# Patient Record
Sex: Male | Born: 1985
Health system: Southern US, Community
[De-identification: ages and names within clinical notes are randomized; demographics above are authoritative.]

## PROBLEM LIST (undated history)

## (undated) DIAGNOSIS — M7752 Other enthesopathy of left foot: Secondary | ICD-10-CM

## (undated) HISTORY — PX: UMBILICAL HERNIA REPAIR: SHX196

## (undated) HISTORY — PX: WISDOM TOOTH EXTRACTION: SHX21

## (undated) HISTORY — DX: Other enthesopathy of left foot and ankle: M77.52

---

## 2004-09-05 ENCOUNTER — Emergency Department (HOSPITAL_COMMUNITY): Admission: EM | Admit: 2004-09-05 | Discharge: 2004-09-05 | Payer: Self-pay | Admitting: Family Medicine

## 2004-11-23 ENCOUNTER — Emergency Department (HOSPITAL_COMMUNITY): Admission: EM | Admit: 2004-11-23 | Discharge: 2004-11-23 | Payer: Self-pay | Admitting: Family Medicine

## 2008-01-08 ENCOUNTER — Emergency Department (HOSPITAL_COMMUNITY): Admission: EM | Admit: 2008-01-08 | Discharge: 2008-01-08 | Payer: Self-pay | Admitting: Emergency Medicine

## 2008-04-13 ENCOUNTER — Emergency Department (HOSPITAL_COMMUNITY): Admission: EM | Admit: 2008-04-13 | Discharge: 2008-04-13 | Payer: Self-pay | Admitting: Family Medicine

## 2011-04-25 LAB — OCCULT BLOOD X 1 CARD TO LAB, STOOL: Fecal Occult Bld: NEGATIVE

## 2012-04-15 ENCOUNTER — Other Ambulatory Visit (HOSPITAL_BASED_OUTPATIENT_CLINIC_OR_DEPARTMENT_OTHER): Payer: Self-pay | Admitting: Nurse Practitioner

## 2012-04-15 ENCOUNTER — Ambulatory Visit (HOSPITAL_BASED_OUTPATIENT_CLINIC_OR_DEPARTMENT_OTHER)
Admission: RE | Admit: 2012-04-15 | Discharge: 2012-04-15 | Disposition: A | Discharge status: Home / Self Care | Payer: Managed Care, Other (non HMO) | Source: Ambulatory Visit | Attending: Nurse Practitioner | Admitting: Nurse Practitioner

## 2012-04-15 DIAGNOSIS — M79609 Pain in unspecified limb: Secondary | ICD-10-CM

## 2012-04-15 DIAGNOSIS — M7730 Calcaneal spur, unspecified foot: Secondary | ICD-10-CM | POA: Insufficient documentation

## 2014-04-20 LAB — LIPID PANEL
Cholesterol: 247 mg/dL — AB (ref 0–200)
HDL: 46 mg/dL (ref 35–70)
LDL Cholesterol: 167 mg/dL
Triglycerides: 171 mg/dL — AB (ref 40–160)

## 2014-06-18 ENCOUNTER — Emergency Department (HOSPITAL_COMMUNITY)
Admission: EM | Admit: 2014-06-18 | Discharge: 2014-06-18 | Disposition: A | Discharge status: Home / Self Care | Payer: BC Managed Care – PPO | Source: Home / Self Care | Attending: Emergency Medicine | Admitting: Emergency Medicine

## 2014-06-18 ENCOUNTER — Encounter (HOSPITAL_COMMUNITY): Payer: Self-pay | Admitting: Emergency Medicine

## 2014-06-18 DIAGNOSIS — G44209 Tension-type headache, unspecified, not intractable: Secondary | ICD-10-CM

## 2014-06-18 DIAGNOSIS — Z8679 Personal history of other diseases of the circulatory system: Secondary | ICD-10-CM

## 2014-06-18 NOTE — ED Notes (Addendum)
C/o HA onset 2 hours; thinks it may be his BP Reports BP was 180/110 while watching a college foot game in the stadium EMS came out and assessed pt; referred to [place] Denies HA, CP, weakness, diaphoresis  Last had ibup an hour ago Alert, talking in complete sentences w/no signs of acute distress.

## 2014-06-18 NOTE — Discharge Instructions (Signed)

## 2014-06-18 NOTE — ED Provider Notes (Signed)
CSN: [id number]     Arrival date & time 06/18/14  1702 History   First MD Initiated Contact with Patient 06/18/14 1710     No chief complaint on file.  (Consider location/radiation/quality/duration/timing/severity/associated sxs/prior Treatment) HPI          28 year old male presents for evaluation of high blood pressure and headache. He was at a football game earlier today when he started to experience a bad headache in the left side of his head. He was seen by EMS, his blood pressure was found to be 180/110. He was told to follow-up at the urgent care. Now, his headache is mostly resolved and he is mostly asymptomatic. He still has a mild headache across his forehead. He denies any symptoms. He specifically denies chest pain, shortness of breath, nausea, vomiting, dizziness, visual changes, numbness, or weakness. He does not take any prescribed medicines.  History reviewed. No pertinent past medical history. History reviewed. No pertinent past surgical history. No family history on file. History  Substance Use Topics  . Smoking status: Never Smoker   . Smokeless tobacco: Not on file  . Alcohol Use: No    Review of Systems  Eyes: Negative for visual disturbance.  Gastrointestinal: Negative for nausea and vomiting.  Neurological: Positive for headaches. Negative for dizziness, weakness and numbness.  All other systems reviewed and are negative.   Allergies  Review of patient's allergies indicates no known allergies.  Home Medications   Prior to Admission medications   Medication Sig Start Date End Date Taking? Authorizing Provider  ibuprofen (ADVIL,MOTRIN) 200 MG tablet Take 200 mg by mouth every 6 (six) hours as needed.   Yes Historical Provider, MD   BP 145/87 mmHg  Pulse 98  Temp(Src) 98.7 F (37.1 C) (Oral)  Resp 16  SpO2 96% Physical Exam  Constitutional: He is oriented to person, place, and time. He appears well-developed and well-nourished. No distress.  HENT:   Head: Normocephalic.  Cardiovascular: Normal rate, regular rhythm, normal heart sounds and intact distal pulses.   Pulmonary/Chest: Effort normal and breath sounds normal. No respiratory distress.  Neurological: He is alert and oriented to person, place, and time. He has normal strength and normal reflexes. No cranial nerve deficit or sensory deficit. He exhibits normal muscle tone. He displays a negative Romberg sign. Coordination and gait normal. GCS eye subscore is 4. GCS verbal subscore is 5. GCS motor subscore is 6.  Skin: Skin is warm and dry. No rash noted. He is not diaphoretic.  Psychiatric: He has a normal mood and affect. Judgment normal.  Nursing note and vitals reviewed.   ED Course  Procedures (including critical care time) Labs Review Labs Reviewed - No data to display  Imaging Review No results found.   MDM   1. Tension headache   2. History of high blood pressure    Exam normal and is BP only slightly elevated. He has a tension headache, no specific interventions are required at this time.  Advised excedrin migraine if headache persists.  Otherwise follow-up with primary care within 1 week for blood pressure recheck    Liam Graham, PA-C 06/18/14 1744

## 2016-07-02 ENCOUNTER — Ambulatory Visit: Payer: Self-pay | Admitting: Internal Medicine

## 2016-07-05 ENCOUNTER — Encounter: Payer: Self-pay | Admitting: Internal Medicine

## 2016-07-05 ENCOUNTER — Ambulatory Visit (INDEPENDENT_AMBULATORY_CARE_PROVIDER_SITE_OTHER): Payer: BLUE CROSS/BLUE SHIELD | Admitting: Internal Medicine

## 2016-07-05 VITALS — BP 120/76 | HR 78 | Resp 16 | Ht 71.75 in | Wt 246.0 lb

## 2016-07-05 DIAGNOSIS — M7732 Calcaneal spur, left foot: Secondary | ICD-10-CM

## 2016-07-05 DIAGNOSIS — G5602 Carpal tunnel syndrome, left upper limb: Secondary | ICD-10-CM

## 2016-07-05 DIAGNOSIS — J4520 Mild intermittent asthma, uncomplicated: Secondary | ICD-10-CM | POA: Insufficient documentation

## 2016-07-05 DIAGNOSIS — E782 Mixed hyperlipidemia: Secondary | ICD-10-CM | POA: Diagnosis not present

## 2016-07-05 NOTE — Patient Instructions (Signed)
Podiatrists in [place]:  Dr. Loralyn Freshwater      Dr. Elta Guadeloupe Pifer      Dr. Iredale/Dr. Santiago Bur  Ibuprofen 200 mg take 3 tablets three times a day with food

## 2016-07-05 NOTE — Progress Notes (Signed)
Date:  07/05/2016   Name:  Richard Paul   DOB:  02/01/1986   MRN:  [id number]   Chief Complaint: Establish Care Richard [facility] ); Elbow Injury (Left elbow pain now and then for years and then recently strained same area. ); and Foot Pain (Bone Spur used to get injection 2 years ago. Gets flare ups at times. ) Foot Pain  This is a recurrent problem. The current episode started more than 1 year ago. The problem occurs intermittently. The problem has been gradually worsening (diagnosed as a bone spur - treated with injections by podiatry). Associated symptoms include arthralgias and myalgias. Pertinent negatives include no abdominal pain, chest pain, coughing, fatigue, headaches, numbness, rash or weakness. The symptoms are aggravated by walking and standing.  Asthma  He complains of chest tightness. There is no cough, shortness of breath or wheezing. This is a recurrent problem. The current episode started more than 1 year ago (age 30). Associated symptoms include myalgias. Pertinent negatives include no appetite change, chest pain or headaches. His past medical history is significant for asthma.  Elbow pain - left elbow injury during football in high school.  Did okay  pain for a while and now more problems, especially when he rests on a hard surface.  No weakness or tingling, no elbow swelling.     Review of Systems  Constitutional: Negative for appetite change, fatigue and unexpected weight change.  Respiratory: Negative for cough, shortness of breath and wheezing.   Cardiovascular: Negative for chest pain, palpitations and leg swelling.  Gastrointestinal: Negative for abdominal pain.  Endocrine: Negative for polydipsia and polyuria.  Genitourinary: Negative for dysuria and hematuria.  Musculoskeletal: Positive for arthralgias and myalgias.  Skin: Negative for color change and rash.  Neurological: Negative for tremors, weakness, numbness and headaches.    Psychiatric/Behavioral: Negative for dysphoric mood.    Patient Active Problem List   Diagnosis Date Noted  . Hyperlipidemia, mixed 07/05/2016    Prior to Admission medications   Not on File    No Known Allergies  Past Surgical History:  Procedure Laterality Date  . UMBILICAL HERNIA REPAIR    . WISDOM TOOTH EXTRACTION      Social History  Substance Use Topics  . Smoking status: Never Smoker  . Smokeless tobacco: Never Used  . Alcohol use Yes     Comment: occassion      Medication list has been reviewed and updated.   Physical Exam  Constitutional: He is oriented to person, place, and time. He appears well-developed. No distress.  HENT:  Head: Normocephalic and atraumatic.  Right Ear: Tympanic membrane and ear canal normal.  Left Ear: Tympanic membrane and ear canal normal.  Mouth/Throat: Oropharynx is clear and moist.  Neck: Normal range of motion. Neck supple. Carotid bruit is not present. No thyromegaly present.  Cardiovascular: Normal rate, regular rhythm and normal heart sounds.   Pulmonary/Chest: Effort normal and breath sounds normal. No respiratory distress. He has no wheezes.  Musculoskeletal: Normal range of motion.       Left elbow: He exhibits no swelling, no effusion and no deformity. No tenderness found.       Left forearm: He exhibits no tenderness and no bony tenderness.       Arms:      Feet:  Neurological: He is alert and oriented to person, place, and time. He has normal strength and normal reflexes. No cranial nerve deficit or sensory deficit.  Skin: Skin is warm  and dry. No rash noted.  Psychiatric: He has a normal mood and affect. His speech is normal and behavior is normal. Thought content normal.  Nursing note and vitals reviewed.   BP 120/76   Pulse 78   Resp 16   Ht 5' 11.75" (1.822 m)   Wt 246 lb (111.6 kg)   SpO2 98%   BMI 33.60 kg/m   Assessment and Plan: 1. Heel spur, left Recommend Advil 600-800 mg tid Consult Podiatry  if not improved  2. 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 Mild intermittent asthma without complication Continue albuterol MDI PRN Recommend flu vaccine but patient declines  4. Hyperlipidemia, mixed Will recheck at [place], MD [facility]  07/05/2016

## 2017-01-03 ENCOUNTER — Ambulatory Visit (INDEPENDENT_AMBULATORY_CARE_PROVIDER_SITE_OTHER): Payer: BLUE CROSS/BLUE SHIELD | Admitting: Internal Medicine

## 2017-01-03 ENCOUNTER — Encounter: Payer: Self-pay | Admitting: Internal Medicine

## 2017-01-03 VITALS — BP 120/82 | HR 84 | Temp 97.6°F | Ht 71.0 in | Wt 243.0 lb

## 2017-01-03 DIAGNOSIS — J4520 Mild intermittent asthma, uncomplicated: Secondary | ICD-10-CM

## 2017-01-03 DIAGNOSIS — G5602 Carpal tunnel syndrome, left upper limb: Secondary | ICD-10-CM

## 2017-01-03 DIAGNOSIS — E782 Mixed hyperlipidemia: Secondary | ICD-10-CM

## 2017-01-03 DIAGNOSIS — Z0001 Encounter for general adult medical examination with abnormal findings: Secondary | ICD-10-CM

## 2017-01-03 DIAGNOSIS — Z0000 Encounter for general adult medical examination without abnormal findings: Secondary | ICD-10-CM

## 2017-01-03 LAB — POCT URINALYSIS DIPSTICK
Bilirubin, UA: NEGATIVE
Glucose, UA: NEGATIVE
Ketones, UA: NEGATIVE
Leukocytes, UA: NEGATIVE
Nitrite, UA: NEGATIVE
Spec Grav, UA: 1.01 (ref 1.010–1.025)
Urobilinogen, UA: 0.2 E.U./dL
pH, UA: 6 (ref 5.0–8.0)

## 2017-01-03 MED ORDER — FLUTICASONE PROPIONATE 50 MCG/ACT NA SUSP: 2.0000 | Freq: Every day | NASAL | 12 refills | Status: DC

## 2017-01-03 NOTE — Progress Notes (Signed)
Date:  01/03/2017   Name:  Richard Paul   DOB:  1986/03/02   MRN:  [id number]   Chief Complaint: Annual Exam; Hyperlipidemia; and Asthma Richard BEENEY is a 31 y.o. male who presents today for his Complete Annual Exam. He feels fairly well. He reports exercising regularly. He reports he is sleeping well.   Hyperlipidemia  This is a chronic problem. Pertinent negatives include no chest pain, myalgias or shortness of breath. Current antihyperlipidemic treatment includes diet change.  Asthma  There is no shortness of breath or wheezing. This is a recurrent problem. The problem occurs rarely. The problem has been unchanged. Pertinent negatives include no appetite change, chest pain, headaches, myalgias or trouble swallowing. His symptoms are alleviated by beta-agonist. He reports significant improvement on treatment. His past medical history is significant for asthma.  Elbow pain - intermittent left elbow shooting pains stemming from an old football injury.    Review of Systems  Constitutional: Negative for appetite change, chills, diaphoresis, fatigue and unexpected weight change.  HENT: Negative for hearing loss, tinnitus, trouble swallowing and voice change.   Eyes: Negative for visual disturbance.  Respiratory: Positive for chest tightness. Negative for choking, shortness of breath and wheezing.   Cardiovascular: Negative for chest pain, palpitations and leg swelling.  Gastrointestinal: Negative for abdominal pain, blood in stool, constipation and diarrhea.  Genitourinary: Negative for difficulty urinating, dysuria and frequency.  Musculoskeletal: Positive for arthralgias. Negative for back pain and myalgias.  Skin: Negative for color change and rash.  Allergic/Immunologic: Positive for environmental allergies.  Neurological: Negative for dizziness, syncope and headaches.  Hematological: Negative for adenopathy.  Psychiatric/Behavioral: Negative for dysphoric mood and sleep  disturbance.    Patient Active Problem List   Diagnosis Date Noted  . Hyperlipidemia, mixed 07/05/2016  . Heel spur, left 07/05/2016  . 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 Mild intermittent asthma without complication 40/98/1191    Prior to Admission medications   Medication Sig Start Date End Date Taking? Authorizing Provider  ALBUTEROL SULFATE HFA IN Inhale 2 puffs into the lungs every 6 (six) hours as needed.    [provider]  fluticasone (FLONASE) 50 MCG/ACT nasal spray Place into both nostrils daily.    [provider]  ibuprofen (ADVIL,MOTRIN) 800 MG tablet Take 800 mg by mouth every 8 (eight) hours as needed.    [provider]  Multiple Vitamins-Minerals (ALIVE MENS ENERGY) TABS Take by mouth.    [provider]  Safflower Oil (TONALIN CLA PO) Take by mouth.    [provider]    No Known Allergies  Past Surgical History:  Procedure Laterality Date  . UMBILICAL HERNIA REPAIR    . WISDOM TOOTH EXTRACTION      Social History  Substance Use Topics  . Smoking status: Never Smoker  . Smokeless tobacco: Never Used  . Alcohol use Yes     Comment: occassion    Depression screen [facility] 2/9 01/03/2017  Decreased Interest 0  Down, Depressed, Hopeless 0  PHQ - 2 Score 0     Medication list has been reviewed and updated.   Physical Exam  Constitutional: He is oriented to person, place, and time. He appears well-developed and well-nourished.  HENT:  Head: Normocephalic.  Right Ear: Tympanic membrane, external ear and ear canal normal.  Left Ear: Tympanic membrane, external ear and ear canal normal.  Nose: Nose normal.  Mouth/Throat: Uvula is midline and oropharynx is clear and  moist.  Eyes: Conjunctivae and EOM are normal. Pupils are equal, round, and reactive to light.  Neck: Normal range of motion. Neck supple. Carotid bruit is not present. No thyromegaly present.  Cardiovascular: Normal rate, regular  rhythm, normal heart sounds and intact distal pulses.   Pulmonary/Chest: Effort normal and breath sounds normal. He has no wheezes. Right breast exhibits no mass. Left breast exhibits no mass.  Abdominal: Soft. Normal appearance and bowel sounds are normal. There is no hepatosplenomegaly. There is no tenderness.  Musculoskeletal: Normal range of motion.       Arms: Lymphadenopathy:    He has no cervical adenopathy.  Neurological: He is alert and oriented to person, place, and time. He has normal strength and normal reflexes. No sensory deficit.  Skin: Skin is warm, dry and intact.  Psychiatric: He has a normal mood and affect. His speech is normal and behavior is normal. Judgment and thought content normal.  Nursing note and vitals reviewed.   BP 120/82   Pulse 84   Temp 97.6 F (36.4 C)   Ht 5\' 11"  (1.803 m)   Wt 243 lb (110.2 kg)   SpO2 98%   BMI 33.89 kg/m   Assessment and Plan: 1. Annual physical exam Normal exam except for weight - begin regular exercise/aerobic activity - POCT urinalysis dipstick  2. Mild intermittent asthma without complication stable - CBC with Differential/Platelet  3. Hyperlipidemia, mixed Advise on medication - Comprehensive metabolic panel - Lipid panel  4. 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 fluticasone (FLONASE) 50 MCG/ACT nasal spray    Sig: Place 2 sprays into both nostrils daily.    Dispense:  16 g    Refill:  [place], MD [facility]  01/03/2017

## 2017-01-03 NOTE — Patient Instructions (Signed)
DASH Eating Plan DASH stands for "Dietary Approaches to Stop Hypertension." The DASH eating plan is a healthy eating plan that has been shown to reduce high blood pressure (hypertension). It may also reduce your risk for type 2 diabetes, heart disease, and stroke. The DASH eating plan may also help with weight loss. What are tips for following this plan? General guidelines  Avoid eating more than 2,300 mg (milligrams) of salt (sodium) a day. If you have hypertension, you may need to reduce your sodium intake to 1,500 mg a day.  Limit alcohol intake to no more than 1 drink a day for nonpregnant women and 2 drinks a day for men. One drink equals 12 oz of beer, 5 oz of wine, or 1 oz of hard liquor.  Work with your health care provider to maintain a healthy body weight or to lose weight. Ask what an ideal weight is for you.  Get at least 30 minutes of exercise that causes your heart to beat faster (aerobic exercise) most days of the week. Activities may include walking, swimming, or biking.  Work with your health care provider or diet and nutrition specialist (dietitian) to adjust your eating plan to your individual calorie needs. Reading food labels  Check food labels for the amount of sodium per serving. Choose foods with less than 5 percent of the Daily Value of sodium. Generally, foods with less than 300 mg of sodium per serving fit into this eating plan.  To find whole grains, look for the word "whole" as the first word in the ingredient list. Shopping  Buy products labeled as "low-sodium" or "no salt added."  Buy fresh foods. Avoid canned foods and premade or frozen meals. Cooking  Avoid adding salt when cooking. Use salt-free seasonings or herbs instead of table salt or sea salt. Check with your health care provider or pharmacist before using salt substitutes.  Do not fry foods. Cook foods using healthy methods such as baking, boiling, grilling, and broiling instead.  Cook with  heart-healthy oils, such as olive, canola, soybean, or sunflower oil. Meal planning   Eat a balanced diet that includes: ? 5 or more servings of fruits and vegetables each day. At each meal, try to fill half of your plate with fruits and vegetables. ? Up to 6-8 servings of whole grains each day. ? Less than 6 oz of lean meat, poultry, or fish each day. A 3-oz serving of meat is about the same size as a deck of cards. One egg equals 1 oz. ? 2 servings of low-fat dairy each day. ? A serving of nuts, seeds, or beans 5 times each week. ? Heart-healthy fats. Healthy fats called Omega-3 fatty acids are found in foods such as flaxseeds and coldwater fish, like sardines, salmon, and mackerel.  Limit how much you eat of the following: ? Canned or prepackaged foods. ? Food that is high in trans fat, such as fried foods. ? Food that is high in saturated fat, such as fatty meat. ? Sweets, desserts, sugary drinks, and other foods with added sugar. ? Full-fat dairy products.  Do not salt foods before eating.  Try to eat at least 2 vegetarian meals each week.  Eat more home-cooked food and less restaurant, buffet, and fast food.  When eating at a restaurant, ask that your food be prepared with less salt or no salt, if possible. What foods are recommended? The items listed may not be a complete list. Talk with your dietitian about what   dietary choices are best for you. Grains Whole-grain or whole-wheat bread. Whole-grain or whole-wheat pasta. Brown rice. Oatmeal. Quinoa. Bulgur. Whole-grain and low-sodium cereals. Pita bread. Low-fat, low-sodium crackers. Whole-wheat flour tortillas. Vegetables Fresh or frozen vegetables (raw, steamed, roasted, or grilled). Low-sodium or reduced-sodium tomato and vegetable juice. Low-sodium or reduced-sodium tomato sauce and tomato paste. Low-sodium or reduced-sodium canned vegetables. Fruits All fresh, dried, or frozen fruit. Canned fruit in natural juice (without  added sugar). Meat and other protein foods Skinless chicken or turkey. Ground chicken or turkey. Pork with fat trimmed off. Fish and seafood. Egg whites. Dried beans, peas, or lentils. Unsalted nuts, nut butters, and seeds. Unsalted canned beans. Lean cuts of beef with fat trimmed off. Low-sodium, lean deli meat. Dairy Low-fat (1%) or fat-free (skim) milk. Fat-free, low-fat, or reduced-fat cheeses. Nonfat, low-sodium ricotta or cottage cheese. Low-fat or nonfat yogurt. Low-fat, low-sodium cheese. Fats and oils Soft margarine without trans fats. Vegetable oil. Low-fat, reduced-fat, or light mayonnaise and salad dressings (reduced-sodium). Canola, safflower, olive, soybean, and sunflower oils. Avocado. Seasoning and other foods Herbs. Spices. Seasoning mixes without salt. Unsalted popcorn and pretzels. Fat-free sweets. What foods are not recommended? The items listed may not be a complete list. Talk with your dietitian about what dietary choices are best for you. Grains Baked goods made with fat, such as croissants, muffins, or some breads. Dry pasta or rice meal packs. Vegetables Creamed or fried vegetables. Vegetables in a cheese sauce. Regular canned vegetables (not low-sodium or reduced-sodium). Regular canned tomato sauce and paste (not low-sodium or reduced-sodium). Regular tomato and vegetable juice (not low-sodium or reduced-sodium). Pickles. Olives. Fruits Canned fruit in a light or heavy syrup. Fried fruit. Fruit in cream or butter sauce. Meat and other protein foods Fatty cuts of meat. Ribs. Fried meat. Bacon. Sausage. Bologna and other processed lunch meats. Salami. Fatback. Hotdogs. Bratwurst. Salted nuts and seeds. Canned beans with added salt. Canned or smoked fish. Whole eggs or egg yolks. Chicken or turkey with skin. Dairy Whole or 2% milk, cream, and half-and-half. Whole or full-fat cream cheese. Whole-fat or sweetened yogurt. Full-fat cheese. Nondairy creamers. Whipped toppings.  Processed cheese and cheese spreads. Fats and oils Butter. Stick margarine. Lard. Shortening. Ghee. Bacon fat. Tropical oils, such as coconut, palm kernel, or palm oil. Seasoning and other foods Salted popcorn and pretzels. Onion salt, garlic salt, seasoned salt, table salt, and sea salt. Worcestershire sauce. Tartar sauce. Barbecue sauce. Teriyaki sauce. Soy sauce, including reduced-sodium. Steak sauce. Canned and packaged gravies. Fish sauce. Oyster sauce. Cocktail sauce. Horseradish that you find on the shelf. Ketchup. Mustard. Meat flavorings and tenderizers. Bouillon cubes. Hot sauce and Tabasco sauce. Premade or packaged marinades. Premade or packaged taco seasonings. Relishes. Regular salad dressings. Where to find more information:  National Heart, Lung, and Blood Institute: www.nhlbi.nih.gov  American Heart Association: www.heart.org Summary  The DASH eating plan is a healthy eating plan that has been shown to reduce high blood pressure (hypertension). It may also reduce your risk for type 2 diabetes, heart disease, and stroke.  With the DASH eating plan, you should limit salt (sodium) intake to 2,300 mg a day. If you have hypertension, you may need to reduce your sodium intake to 1,500 mg a day.  When on the DASH eating plan, aim to eat more fresh fruits and vegetables, whole grains, lean proteins, low-fat dairy, and heart-healthy fats.  Work with your health care provider or diet and nutrition specialist (dietitian) to adjust your eating plan to your individual   calorie needs. This information is not intended to replace advice given to you by your health care provider. Make sure you discuss any questions you have with your health care provider. Document Released: 07/04/2011 Document Revised: 07/08/2016 Document Reviewed: 07/08/2016 Elsevier Interactive Patient Education  2017 [employer]  

## 2017-01-04 LAB — CBC WITH DIFFERENTIAL/PLATELET
Basophils Absolute: 0 10*3/uL (ref 0.0–0.2)
Basos: 0 %
EOS (ABSOLUTE): 0 10*3/uL (ref 0.0–0.4)
Eos: 1 %
Hematocrit: 43.6 % (ref 37.5–51.0)
Hemoglobin: 14.5 g/dL (ref 13.0–17.7)
Immature Grans (Abs): 0 10*3/uL (ref 0.0–0.1)
Immature Granulocytes: 0 %
Lymphocytes Absolute: 2.1 10*3/uL (ref 0.7–3.1)
Lymphs: 46 %
MCH: 30.9 pg (ref 26.6–33.0)
MCHC: 33.3 g/dL (ref 31.5–35.7)
MCV: 93 fL (ref 79–97)
Monocytes Absolute: 0.2 10*3/uL (ref 0.1–0.9)
Monocytes: 5 %
Neutrophils Absolute: 2.2 10*3/uL (ref 1.4–7.0)
Neutrophils: 48 %
Platelets: 305 10*3/uL (ref 150–379)
RBC: 4.7 x10E6/uL (ref 4.14–5.80)
RDW: 13.6 % (ref 12.3–15.4)
WBC: 4.6 10*3/uL (ref 3.4–10.8)

## 2017-01-04 LAB — COMPREHENSIVE METABOLIC PANEL
ALT: 32 IU/L (ref 0–44)
AST: 26 IU/L (ref 0–40)
Albumin/Globulin Ratio: 1.7 (ref 1.2–2.2)
Albumin: 4.7 g/dL (ref 3.5–5.5)
Alkaline Phosphatase: 73 IU/L (ref 39–117)
BUN/Creatinine Ratio: 10 (ref 9–20)
BUN: 10 mg/dL (ref 6–20)
Bilirubin Total: 0.3 mg/dL (ref 0.0–1.2)
CO2: 24 mmol/L (ref 18–29)
Calcium: 10 mg/dL (ref 8.7–10.2)
Chloride: 103 mmol/L (ref 96–106)
Creatinine, Ser: 1 mg/dL (ref 0.76–1.27)
GFR calc Af Amer: 114 mL/min/{1.73_m2} (ref >59)
GFR calc non Af Amer: 98 mL/min/{1.73_m2} (ref >59)
Globulin, Total: 2.8 g/dL (ref 1.5–4.5)
Glucose: 90 mg/dL (ref 65–99)
Potassium: 4.5 mmol/L (ref 3.5–5.2)
Sodium: 142 mmol/L (ref 134–144)
Total Protein: 7.5 g/dL (ref 6.0–8.5)

## 2017-01-04 LAB — LIPID PANEL
Chol/HDL Ratio: 5.1 ratio — ABNORMAL HIGH (ref 0.0–5.0)
Cholesterol, Total: 250 mg/dL — ABNORMAL HIGH (ref 100–199)
HDL: 49 mg/dL (ref >39)
LDL Calculated: 172 mg/dL — ABNORMAL HIGH (ref 0–99)
Triglycerides: 145 mg/dL (ref 0–149)
VLDL Cholesterol Cal: 29 mg/dL (ref 5–40)

## 2017-01-16 DIAGNOSIS — S46312A Strain of muscle, fascia and tendon of triceps, left arm, initial encounter: Secondary | ICD-10-CM | POA: Diagnosis not present

## 2017-11-07 DIAGNOSIS — R0602 Shortness of breath: Secondary | ICD-10-CM | POA: Diagnosis not present

## 2017-11-07 DIAGNOSIS — R7989 Other specified abnormal findings of blood chemistry: Secondary | ICD-10-CM | POA: Diagnosis not present

## 2017-11-07 DIAGNOSIS — R0789 Other chest pain: Secondary | ICD-10-CM | POA: Diagnosis not present

## 2017-11-07 DIAGNOSIS — R05 Cough: Secondary | ICD-10-CM | POA: Diagnosis not present

## 2017-11-07 DIAGNOSIS — R079 Chest pain, unspecified: Secondary | ICD-10-CM | POA: Diagnosis not present

## 2017-11-10 ENCOUNTER — Telehealth: Payer: Self-pay

## 2017-11-10 NOTE — Telephone Encounter (Signed)
Patient stated he has recent visit in ER for panic attacks. Wanted to talk to Dr Army Melia about these panic attacks and possible medications to help with this. Called and left Vm informing needs to call office and schedule appt for discussion but we would be glad to see him.

## 2017-11-12 ENCOUNTER — Ambulatory Visit (INDEPENDENT_AMBULATORY_CARE_PROVIDER_SITE_OTHER): Payer: BLUE CROSS/BLUE SHIELD | Admitting: Internal Medicine

## 2017-11-12 ENCOUNTER — Encounter: Payer: Self-pay | Admitting: Internal Medicine

## 2017-11-12 VITALS — BP 128/78 | HR 70 | Ht 71.0 in | Wt 240.0 lb

## 2017-11-12 DIAGNOSIS — F411 Generalized anxiety disorder: Secondary | ICD-10-CM | POA: Insufficient documentation

## 2017-11-12 DIAGNOSIS — F419 Anxiety disorder, unspecified: Secondary | ICD-10-CM | POA: Diagnosis not present

## 2017-11-12 DIAGNOSIS — J4520 Mild intermittent asthma, uncomplicated: Secondary | ICD-10-CM | POA: Diagnosis not present

## 2017-11-12 MED ORDER — ALBUTEROL SULFATE HFA 108 (90 BASE) MCG/ACT IN AERS: 2.0000 | INHALATION_SPRAY | Freq: Four times a day (QID) | RESPIRATORY_TRACT | 2 refills | Status: DC | PRN

## 2017-11-12 MED ORDER — CLONAZEPAM 0.5 MG PO TABS: 0.5000 mg | ORAL_TABLET | Freq: Two times a day (BID) | ORAL | 0 refills | Status: DC | PRN

## 2017-11-12 NOTE — Progress Notes (Signed)
Date:  11/12/2017   Name:  Richard Paul   DOB:  October 26, 1985   MRN:  [id number]   Chief Complaint: Anxiety (Thursday while at the gym- felt SOB. Inhaler didn't seem to help much. Finished workout fine. Chest pain and heart was racing. Did not feel safe to sleep so went to ER. Last time this happened was about 10 years ago. GAD7- 9. )  EKG - NSR @ 86 CXR negative Troponin negative; CBC and CMP negative  He feels that he is more on edge and irritable than usual.  His father is having some health issues that are likely contributing to his sx. He is happy with work and home.  He has never taken medication for anxiety or depression.  Since his ED evaluation, he has had no further chest pain or chest heaviness.  He had not used his inhaler.  Asthma - mild intermittent sx; uses inhaler as needed.  Review of Systems  Constitutional: Negative for chills, fatigue and fever.  Respiratory: Negative for cough, chest tightness, shortness of breath and wheezing.   Cardiovascular: Negative for chest pain, palpitations and leg swelling.  Musculoskeletal: Negative for arthralgias.  Skin: Negative for color change and rash.  Neurological: Negative for dizziness and headaches.  Psychiatric/Behavioral: Negative for agitation, decreased concentration, dysphoric mood, self-injury, sleep disturbance and suicidal ideas. The patient is nervous/anxious.     Patient Active Problem List   Diagnosis Date Noted  . Hyperlipidemia, mixed 07/05/2016  . Heel spur, left 07/05/2016  . 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 Mild intermittent asthma without complication 40/98/1191    Prior to Admission medications   Medication Sig Start Date End Date Taking? Authorizing Provider  ALBUTEROL SULFATE HFA IN Inhale 2 puffs into the lungs every 6 (six) hours as needed.   Yes [provider]  fluticasone (FLONASE) 50 MCG/ACT nasal spray Place 2 sprays into both nostrils daily. 01/03/17  Yes  Glean Hess, MD  ibuprofen (ADVIL,MOTRIN) 800 MG tablet Take 800 mg by mouth every 8 (eight) hours as needed.   Yes [provider]  Multiple Vitamins-Minerals (ALIVE MENS ENERGY) TABS Take by mouth.   Yes [provider]    No Known Allergies  Past Surgical History:  Procedure Laterality Date  . UMBILICAL HERNIA REPAIR    . WISDOM TOOTH EXTRACTION      Social History   Tobacco Use  . Smoking status: Never Smoker  . Smokeless tobacco: Never Used  Substance Use Topics  . Alcohol use: Yes    Comment: occassion   . Drug use: No     Medication list has been reviewed and updated.  PHQ 2/9 Scores 11/12/2017 01/03/2017  PHQ - 2 Score 0 0   GAD 7 : Generalized Anxiety Score 11/12/2017  Nervous, Anxious, on Edge 2  Control/stop worrying 2  Worry too much - different things 2  Trouble relaxing 0  Restless 0  Easily annoyed or irritable 2  Afraid - awful might happen 1  Total GAD 7 Score 9  Anxiety Difficulty Not difficult at all    Physical Exam  Constitutional: He is oriented to person, place, and time. He appears well-developed. No distress.  HENT:  Head: Normocephalic and atraumatic.  Neck: Normal range of motion. Neck supple. Carotid bruit is not present.  Cardiovascular: Normal rate, regular rhythm and normal heart sounds.  Pulmonary/Chest: Effort normal and breath sounds normal. No respiratory distress.  Musculoskeletal: He exhibits no edema.  Neurological: He is alert and oriented to person, place, and time.  Skin: Skin is warm and dry. No rash noted.  Psychiatric: He has a normal mood and affect. His speech is normal and behavior is normal. Judgment and thought content normal. Cognition and memory are normal.    BP 128/78   Pulse 70   Ht 5\' 11"  (1.803 m)   Wt 240 lb (108.9 kg)   SpO2 98%   BMI 33.47 kg/m   Assessment and Plan: 1. Anxiety disorder, unspecified type Use clonazepam only PRN if sx occur Follow up if worsening to discuss  daily medications - clonazePAM (KLONOPIN) 0.5 MG tablet; Take 1 tablet (0.5 mg total) by mouth 2 (two) times daily as needed for anxiety.  Dispense: 15 tablet; Refill: 0  2. Mild intermittent asthma without complication Continue albuterol PRN  Meds ordered this encounter  Medications  . clonazePAM (KLONOPIN) 0.5 MG tablet    Sig: Take 1 tablet (0.5 mg total) by mouth 2 (two) times daily as needed for anxiety.    Dispense:  15 tablet    Refill:  0  . albuterol (PROVENTIL HFA;VENTOLIN HFA) 108 (90 Base) MCG/ACT inhaler    Sig: Inhale 2 puffs into the lungs every 6 (six) hours as needed.    Dispense:  18 g    Refill:  2    Substitute any covered generic albuterol inhaler    Partially dictated using Editor, commissioning. Any errors are unintentional.  Halina Maidens, MD [facility]  11/12/2017

## 2018-01-30 ENCOUNTER — Other Ambulatory Visit: Payer: Self-pay | Admitting: Internal Medicine

## 2018-06-09 ENCOUNTER — Encounter: Payer: BLUE CROSS/BLUE SHIELD | Admitting: Internal Medicine

## 2018-06-09 ENCOUNTER — Ambulatory Visit (INDEPENDENT_AMBULATORY_CARE_PROVIDER_SITE_OTHER): Payer: BLUE CROSS/BLUE SHIELD | Admitting: Internal Medicine

## 2018-06-09 ENCOUNTER — Encounter: Payer: Self-pay | Admitting: Internal Medicine

## 2018-06-09 VITALS — BP 132/82 | HR 75 | Ht 71.0 in | Wt 241.0 lb

## 2018-06-09 DIAGNOSIS — J4520 Mild intermittent asthma, uncomplicated: Secondary | ICD-10-CM | POA: Diagnosis not present

## 2018-06-09 DIAGNOSIS — Z23 Encounter for immunization: Secondary | ICD-10-CM

## 2018-06-09 DIAGNOSIS — Z0000 Encounter for general adult medical examination without abnormal findings: Secondary | ICD-10-CM

## 2018-06-09 LAB — POCT URINALYSIS DIPSTICK
Bilirubin, UA: NEGATIVE
Glucose, UA: NEGATIVE
Ketones, UA: NEGATIVE
Leukocytes, UA: NEGATIVE
Nitrite, UA: NEGATIVE
Protein, UA: NEGATIVE
Spec Grav, UA: 1.015 (ref 1.010–1.025)
Urobilinogen, UA: 0.2 E.U./dL
pH, UA: 6 (ref 5.0–8.0)

## 2018-06-09 NOTE — Progress Notes (Signed)
Date:  06/09/2018   Name:  Richard Paul   DOB:  1986/05/26   MRN:  [id number]   Chief Complaint: Annual Exam (Would like updated tetnus shot. Scratched by nail in august moving things in girlfriends home. Mychart let him know he needed a tetnus so he wanted to get one today. ) Richard Paul is a 32 y.o. male who presents today for his Complete Annual Exam. He feels well. He reports exercising regularly. He reports he is sleeping well.  He requests the TDap but declines influenza vaccine.  Asthma  He complains of wheezing. There is no shortness of breath. This is a recurrent problem. The problem occurs intermittently (about once a week uses albuterol when he works out). Pertinent negatives include no appetite change, chest pain, headaches, myalgias or trouble swallowing. His symptoms are alleviated by beta-agonist. His symptoms are not alleviated by cold air. His past medical history is significant for asthma.    Review of Systems  Constitutional: Negative for appetite change, chills, diaphoresis, fatigue and unexpected weight change.  HENT: Negative for hearing loss, tinnitus, trouble swallowing and voice change.   Eyes: Negative for visual disturbance.  Respiratory: Positive for wheezing. Negative for choking and shortness of breath.   Cardiovascular: Negative for chest pain, palpitations and leg swelling.  Gastrointestinal: Negative for abdominal pain, blood in stool, constipation and diarrhea.  Genitourinary: Negative for difficulty urinating, dysuria and frequency.  Musculoskeletal: Negative for arthralgias, back pain and myalgias.  Skin: Negative for color change and rash.  Allergic/Immunologic: Negative for environmental allergies.  Neurological: Negative for dizziness, syncope and headaches.  Hematological: Negative for adenopathy.  Psychiatric/Behavioral: Negative for dysphoric mood and sleep disturbance.    Patient Active Problem List   Diagnosis Date Noted  . Anxiety  disorder 11/12/2017  . Hyperlipidemia, mixed 07/05/2016  . Heel spur, left 07/05/2016  . 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 Mild intermittent asthma without complication 89/21/1941    No Known Allergies  Past Surgical History:  Procedure Laterality Date  . UMBILICAL HERNIA REPAIR    . WISDOM TOOTH EXTRACTION      Social History   Tobacco Use  . Smoking status: Never Smoker  . Smokeless tobacco: Never Used  Substance Use Topics  . Alcohol use: Yes    Comment: occassion   . Drug use: No     Medication list has been reviewed and updated.  Current Meds  Medication Sig  . albuterol (PROVENTIL HFA;VENTOLIN HFA) 108 (90 Base) MCG/ACT inhaler Inhale 2 puffs into the lungs every 6 (six) hours as needed.  . clonazePAM (KLONOPIN) 0.5 MG tablet Take 1 tablet (0.5 mg total) by mouth 2 (two) times daily as needed for anxiety.  . fluticasone (FLONASE) 50 MCG/ACT nasal spray SPRAY 2 SPRAYS INTO EACH NOSTRIL EVERY DAY  . ibuprofen (ADVIL,MOTRIN) 800 MG tablet Take 800 mg by mouth every 8 (eight) hours as needed.  . Multiple Vitamins-Minerals (ALIVE MENS ENERGY) TABS Take by mouth.    PHQ 2/9 Scores 06/09/2018 11/12/2017 01/03/2017  PHQ - 2 Score 0 0 0    Physical Exam  Constitutional: He is oriented to person, place, and time. He appears well-developed and well-nourished.  HENT:  Head: Normocephalic.  Right Ear: Tympanic membrane, external ear and ear canal normal.  Left Ear: Tympanic membrane, external ear and ear canal normal.  Nose: Nose normal.  Mouth/Throat: Uvula is midline and oropharynx is clear and moist.  Eyes: Pupils are equal, round,  and reactive to light. Conjunctivae and EOM are normal.  Neck: Normal range of motion. Neck supple. Carotid bruit is not present. No thyromegaly present.  Cardiovascular: Normal rate, regular rhythm, normal heart sounds and intact distal pulses.  Pulmonary/Chest: Effort normal and breath sounds normal. He has no wheezes.  Right breast exhibits no mass. Left breast exhibits no mass.  Abdominal: Soft. Normal appearance and bowel sounds are normal. There is no hepatosplenomegaly. There is no tenderness.  Musculoskeletal: Normal range of motion.  Lymphadenopathy:    He has no cervical adenopathy.  Neurological: He is alert and oriented to person, place, and time. He has normal reflexes.  Skin: Skin is warm, dry and intact.  Psychiatric: He has a normal mood and affect. His speech is normal and behavior is normal. Judgment and thought content normal.  Nursing note and vitals reviewed.   BP 132/82 (BP Location: Right Arm, Patient Position: Sitting, Cuff Size: Large)   Pulse 75   Ht 5\' 11"  (1.803 m)   Wt 241 lb (109.3 kg)   SpO2 97%   BMI 33.61 kg/m   Assessment and Plan: 1. Annual physical exam Resume regular exercise and work on low fat diet - CBC with Differential/Platelet - Comprehensive metabolic panel - Lipid panel - HIV Antibody (routine testing w rflx) - POCT urinalysis dipstick  2. Mild intermittent asthma without complication Stable, continue MDI PRN Declines flu vaccine  3. Need for diphtheria-tetanus-pertussis (Tdap) vaccine - Tdap vaccine greater than or equal to 7yo IM   Partially dictated using Editor, commissioning. Any errors are unintentional.  Halina Maidens, MD [facility]  06/09/2018

## 2018-06-09 NOTE — Patient Instructions (Signed)
Tdap Vaccine (Tetanus, Diphtheria and Pertussis): What You Need to Know 1. Why get vaccinated? Tetanus, diphtheria and pertussis are very serious diseases. Tdap vaccine can protect us from these diseases. And, Tdap vaccine given to pregnant women can protect newborn babies against pertussis. TETANUS (Lockjaw) is rare in the [place] today. It causes painful muscle tightening and stiffness, usually all over the body.  It can lead to tightening of muscles in the head and neck so you can't open your mouth, swallow, or sometimes even breathe. Tetanus kills about 1 out of 10 people who are infected even after receiving the best medical care.  DIPHTHERIA is also rare in the [place] today. It can cause a thick coating to form in the back of the throat.  It can lead to breathing problems, heart failure, paralysis, and death.  PERTUSSIS (Whooping Cough) causes severe coughing spells, which can cause difficulty breathing, vomiting and disturbed sleep.  It can also lead to weight loss, incontinence, and rib fractures. Up to 2 in 100 adolescents and 5 in 100 adults with pertussis are hospitalized or have complications, which could include pneumonia or death.  These diseases are caused by bacteria. Diphtheria and pertussis are spread from person to person through secretions from coughing or sneezing. Tetanus enters the body through cuts, scratches, or wounds. Before vaccines, as many as 200,000 cases of diphtheria, 200,000 cases of pertussis, and hundreds of cases of tetanus, were reported in the [place] each year. Since vaccination began, reports of cases for tetanus and diphtheria have dropped by about 99% and for pertussis by about 80%. 2. Tdap vaccine Tdap vaccine can protect adolescents and adults from tetanus, diphtheria, and pertussis. One dose of Tdap is routinely given at age 11 or 12. People who did not get Tdap at that age should get it as soon as possible. Tdap is especially  important for healthcare professionals and anyone having close contact with a baby younger than 12 months. Pregnant women should get a dose of Tdap during every pregnancy, to protect the newborn from pertussis. Infants are most at risk for severe, life-threatening complications from pertussis. Another vaccine, called Td, protects against tetanus and diphtheria, but not pertussis. A Td booster should be given every 10 years. Tdap may be given as one of these boosters if you have never gotten Tdap before. Tdap may also be given after a severe cut or burn to prevent tetanus infection. Your doctor or the person giving you the vaccine can give you more information. Tdap may safely be given at the same time as other vaccines. 3. Some people should not get this vaccine  A person who has ever had a life-threatening allergic reaction after a previous dose of any diphtheria, tetanus or pertussis containing vaccine, OR has a severe allergy to any part of this vaccine, should not get Tdap vaccine. Tell the person giving the vaccine about any severe allergies.  Anyone who had coma or long repeated seizures within 7 days after a childhood dose of DTP or DTaP, or a previous dose of Tdap, should not get Tdap, unless a cause other than the vaccine was found. They can still get Td.  Talk to your doctor if you: ? have seizures or another nervous system problem, ? had severe pain or swelling after any vaccine containing diphtheria, tetanus or pertussis, ? ever had a condition called Guillain-Barr Syndrome (GBS), ? aren't feeling well on the day the shot is scheduled. 4. Risks With any medicine, including   vaccines, there is a chance of side effects. These are usually mild and go away on their own. Serious reactions are also possible but are rare. Most people who get Tdap vaccine do not have any problems with it. Mild problems following Tdap: (Did not interfere with activities)  Pain where the shot was given (about  3 in 4 adolescents or 2 in 3 adults)  Redness or swelling where the shot was given (about 1 person in 5)  Mild fever of at least 100.4F (up to about 1 in 25 adolescents or 1 in 100 adults)  Headache (about 3 or 4 people in 10)  Tiredness (about 1 person in 3 or 4)  Nausea, vomiting, diarrhea, stomach ache (up to 1 in 4 adolescents or 1 in 10 adults)  Chills, sore joints (about 1 person in 10)  Body aches (about 1 person in 3 or 4)  Rash, swollen glands (uncommon)  Moderate problems following Tdap: (Interfered with activities, but did not require medical attention)  Pain where the shot was given (up to 1 in 5 or 6)  Redness or swelling where the shot was given (up to about 1 in 16 adolescents or 1 in 12 adults)  Fever over 102F (about 1 in 100 adolescents or 1 in 250 adults)  Headache (about 1 in 7 adolescents or 1 in 10 adults)  Nausea, vomiting, diarrhea, stomach ache (up to 1 or 3 people in 100)  Swelling of the entire arm where the shot was given (up to about 1 in 500).  Severe problems following Tdap: (Unable to perform usual activities; required medical attention)  Swelling, severe pain, bleeding and redness in the arm where the shot was given (rare).  Problems that could happen after any vaccine:  People sometimes faint after a medical procedure, including vaccination. Sitting or lying down for about 15 minutes can help prevent fainting, and injuries caused by a fall. Tell your doctor if you feel dizzy, or have vision changes or ringing in the ears.  Some people get severe pain in the shoulder and have difficulty moving the arm where a shot was given. This happens very rarely.  Any medication can cause a severe allergic reaction. Such reactions from a vaccine are very rare, estimated at fewer than 1 in a million doses, and would happen within a few minutes to a few hours after the vaccination. As with any medicine, there is a very remote chance of a vaccine  causing a serious injury or death. The safety of vaccines is always being monitored. For more information, visit: www.cdc.gov/vaccinesafety/ 5. What if there is a serious problem? What should I look for? Look for anything that concerns you, such as signs of a severe allergic reaction, very high fever, or unusual behavior. Signs of a severe allergic reaction can include hives, swelling of the face and throat, difficulty breathing, a fast heartbeat, dizziness, and weakness. These would usually start a few minutes to a few hours after the vaccination. What should I do?  If you think it is a severe allergic reaction or other emergency that can't wait, call 9-1-1 or get the person to the nearest hospital. Otherwise, call your doctor.  Afterward, the reaction should be reported to the Vaccine Adverse Event Reporting System (VAERS). Your doctor might file this report, or you can do it yourself through the VAERS web site at www.vaers.hhs.gov, or by calling 1-800-822-7967. ? VAERS does not give medical advice. 6. The National Vaccine Injury Compensation Program The National   Vaccine Injury Compensation Program (VICP) is a federal program that was created to compensate people who may have been injured by certain vaccines. Persons who believe they may have been injured by a vaccine can learn about the program and about filing a claim by calling 1-800-338-2382 or visiting the VICP website at www.hrsa.gov/vaccinecompensation. There is a time limit to file a claim for compensation. 7. How can I learn more?  Ask your doctor. He or she can give you the vaccine package insert or suggest other sources of information.  Call your local or state health department.  Contact the Centers for Disease Control and Prevention (CDC): ? Call 1-800-232-4636 (1-800-CDC-INFO) or ? Visit CDC's website at www.cdc.gov/vaccines CDC Tdap Vaccine VIS (09/21/13) This information is not intended to replace advice given to you by your  health care provider. Make sure you discuss any questions you have with your health care provider. Document Released: 01/14/2012 Document Revised: 04/04/2016 Document Reviewed: 04/04/2016 Elsevier Interactive Patient Education  2017 Elsevier Inc.  

## 2018-06-10 LAB — COMPREHENSIVE METABOLIC PANEL
ALT: 61 IU/L — ABNORMAL HIGH (ref 0–44)
AST: 40 IU/L (ref 0–40)
Albumin/Globulin Ratio: 2 (ref 1.2–2.2)
Albumin: 4.6 g/dL (ref 3.5–5.5)
Alkaline Phosphatase: 92 IU/L (ref 39–117)
BUN/Creatinine Ratio: 13 (ref 9–20)
BUN: 14 mg/dL (ref 6–20)
Bilirubin Total: <0.2 mg/dL (ref 0.0–1.2)
CO2: 24 mmol/L (ref 20–29)
Calcium: 9.6 mg/dL (ref 8.7–10.2)
Chloride: 101 mmol/L (ref 96–106)
Creatinine, Ser: 1.08 mg/dL (ref 0.76–1.27)
GFR calc Af Amer: 103 mL/min/{1.73_m2} (ref >59)
GFR calc non Af Amer: 89 mL/min/{1.73_m2} (ref >59)
Globulin, Total: 2.3 g/dL (ref 1.5–4.5)
Glucose: 87 mg/dL (ref 65–99)
Potassium: 4.5 mmol/L (ref 3.5–5.2)
Sodium: 141 mmol/L (ref 134–144)
Total Protein: 6.9 g/dL (ref 6.0–8.5)

## 2018-06-10 LAB — LIPID PANEL
Chol/HDL Ratio: 4.5 ratio (ref 0.0–5.0)
Cholesterol, Total: 281 mg/dL — ABNORMAL HIGH (ref 100–199)
HDL: 63 mg/dL (ref >39)
LDL Calculated: 192 mg/dL — ABNORMAL HIGH (ref 0–99)
Triglycerides: 130 mg/dL (ref 0–149)
VLDL Cholesterol Cal: 26 mg/dL (ref 5–40)

## 2018-06-10 LAB — CBC WITH DIFFERENTIAL/PLATELET
Basophils Absolute: 0 10*3/uL (ref 0.0–0.2)
Basos: 0 %
EOS (ABSOLUTE): 0 10*3/uL (ref 0.0–0.4)
Eos: 1 %
Hematocrit: 41.5 % (ref 37.5–51.0)
Hemoglobin: 13.6 g/dL (ref 13.0–17.7)
Immature Grans (Abs): 0 10*3/uL (ref 0.0–0.1)
Immature Granulocytes: 0 %
Lymphocytes Absolute: 2.1 10*3/uL (ref 0.7–3.1)
Lymphs: 43 %
MCH: 29.8 pg (ref 26.6–33.0)
MCHC: 32.8 g/dL (ref 31.5–35.7)
MCV: 91 fL (ref 79–97)
Monocytes Absolute: 0.3 10*3/uL (ref 0.1–0.9)
Monocytes: 7 %
Neutrophils Absolute: 2.4 10*3/uL (ref 1.4–7.0)
Neutrophils: 49 %
Platelets: 273 10*3/uL (ref 150–450)
RBC: 4.56 x10E6/uL (ref 4.14–5.80)
RDW: 12.7 % (ref 12.3–15.4)
WBC: 4.9 10*3/uL (ref 3.4–10.8)

## 2018-06-10 LAB — HIV ANTIBODY (ROUTINE TESTING W REFLEX): HIV Screen 4th Generation wRfx: NONREACTIVE

## 2018-07-10 DIAGNOSIS — S63511A Sprain of carpal joint of right wrist, initial encounter: Secondary | ICD-10-CM | POA: Diagnosis not present

## 2018-08-20 DIAGNOSIS — R6889 Other general symptoms and signs: Secondary | ICD-10-CM | POA: Diagnosis not present

## 2018-08-20 DIAGNOSIS — D72819 Decreased white blood cell count, unspecified: Secondary | ICD-10-CM | POA: Diagnosis not present

## 2018-08-20 DIAGNOSIS — A09 Infectious gastroenteritis and colitis, unspecified: Secondary | ICD-10-CM | POA: Diagnosis not present

## 2018-08-20 LAB — CBC AND DIFFERENTIAL
Hemoglobin: 14.4 (ref 13.5–17.5)
Platelets: 197 (ref 150–399)
WBC: 2.2

## 2018-08-24 ENCOUNTER — Ambulatory Visit: Payer: BLUE CROSS/BLUE SHIELD | Admitting: Internal Medicine

## 2018-08-28 ENCOUNTER — Encounter: Payer: Self-pay | Admitting: Internal Medicine

## 2018-08-28 ENCOUNTER — Ambulatory Visit: Payer: BLUE CROSS/BLUE SHIELD | Admitting: Internal Medicine

## 2018-08-28 ENCOUNTER — Other Ambulatory Visit: Payer: Self-pay

## 2018-08-28 VITALS — BP 116/80 | HR 78 | Ht 71.0 in | Wt 230.0 lb

## 2018-08-28 DIAGNOSIS — Z23 Encounter for immunization: Secondary | ICD-10-CM

## 2018-08-28 DIAGNOSIS — D72819 Decreased white blood cell count, unspecified: Secondary | ICD-10-CM

## 2018-08-28 NOTE — Progress Notes (Signed)
Date:  08/28/2018   Name:  Richard Paul   DOB:  12/08/1985   MRN:  [id number]   Chief Complaint: Recheck CBC  Patient was seen at [facility] one week ago with flu like sx and diarrhea.  He tested negative for influenza and was discharged with supportive care, BRAT diet and anti-diarrheal meds. However, he was noted to have a very low WBC and was told to follow up.  Lab Results  Component Value Date   WBC 2.2 08/20/2018   HGB 14.4 08/20/2018   HCT 41.5 06/09/2018   MCV 91 06/09/2018   PLT 197 08/20/2018    Review of Systems  Constitutional: Negative for chills, fatigue and fever.  Respiratory: Negative for cough, chest tightness, shortness of breath and wheezing.   Cardiovascular: Negative for chest pain and palpitations.  Gastrointestinal: Negative for abdominal pain, constipation and diarrhea.  Genitourinary: Negative for dysuria.  Skin: Negative for wound.  Neurological: Negative for dizziness and headaches.  Psychiatric/Behavioral: Negative for sleep disturbance.    Patient Active Problem List   Diagnosis Date Noted  . Anxiety disorder 11/12/2017  . Hyperlipidemia, mixed 07/05/2016  . Heel spur, left 07/05/2016  . 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 Mild intermittent asthma without complication 35/36/1443    No Known Allergies  Past Surgical History:  Procedure Laterality Date  . UMBILICAL HERNIA REPAIR    . WISDOM TOOTH EXTRACTION      Social History   Tobacco Use  . Smoking status: Never Smoker  . Smokeless tobacco: Never Used  Substance Use Topics  . Alcohol use: Yes    Comment: occassion   . Drug use: No     Medication list has been reviewed and updated.  Current Meds  Medication Sig  . albuterol (PROVENTIL HFA;VENTOLIN HFA) 108 (90 Base) MCG/ACT inhaler Inhale 2 puffs into the lungs every 6 (six) hours as needed.  . clonazePAM (KLONOPIN) 0.5 MG tablet Take 1 tablet (0.5 mg total) by mouth 2 (two) times daily as needed for  anxiety.  . fluticasone (FLONASE) 50 MCG/ACT nasal spray SPRAY 2 SPRAYS INTO EACH NOSTRIL EVERY DAY  . ibuprofen (ADVIL,MOTRIN) 800 MG tablet Take 800 mg by mouth every 8 (eight) hours as needed.  . Multiple Vitamins-Minerals (ALIVE MENS ENERGY) TABS Take by mouth.    PHQ 2/9 Scores 08/28/2018 06/09/2018 11/12/2017 01/03/2017  PHQ - 2 Score 0 0 0 0    Physical Exam Vitals signs and nursing note reviewed.  Constitutional:      General: He is not in acute distress.    Appearance: He is well-developed.  HENT:     Head: Normocephalic and atraumatic.     Right Ear: Tympanic membrane and ear canal normal.     Left Ear: Tympanic membrane and ear canal normal.     Mouth/Throat:     Mouth: Mucous membranes are moist.  Eyes:     Pupils: Pupils are equal, round, and reactive to light.  Neck:     Musculoskeletal: Normal range of motion and neck supple.  Cardiovascular:     Rate and Rhythm: Normal rate and regular rhythm.     Pulses: Normal pulses.  Pulmonary:     Effort: Pulmonary effort is normal. No respiratory distress.     Breath sounds: Normal breath sounds.  Abdominal:     General: Abdomen is flat.     Palpations: Abdomen is soft.     Tenderness: There is no abdominal tenderness. There  is no guarding or rebound.  Musculoskeletal: Normal range of motion.  Lymphadenopathy:     Cervical: No cervical adenopathy.  Skin:    General: Skin is warm and dry.     Findings: No rash.  Neurological:     Mental Status: He is alert and oriented to person, place, and time.  Psychiatric:        Behavior: Behavior normal.        Thought Content: Thought content normal.     BP 116/80   Pulse 78   Ht 5\' 11"  (1.803 m)   Wt 230 lb (104.3 kg)   SpO2 97%   BMI 32.08 kg/m   Assessment and Plan: 1. Leukopenia, unspecified type Repeat and refer if still abnormal - CBC w/Diff/Platelet  2. Need for immunization against influenza - Flu Vaccine QUAD 36+ mos IM   Partially dictated using  Editor, commissioning. Any errors are unintentional.  Halina Maidens, MD [facility]  08/28/2018

## 2018-08-29 LAB — CBC WITH DIFFERENTIAL/PLATELET
Basophils Absolute: 0 10*3/uL (ref 0.0–0.2)
Basos: 1 %
EOS (ABSOLUTE): 0 10*3/uL (ref 0.0–0.4)
Eos: 1 %
Hematocrit: 39.9 % (ref 37.5–51.0)
Hemoglobin: 14.1 g/dL (ref 13.0–17.7)
Immature Grans (Abs): 0 10*3/uL (ref 0.0–0.1)
Immature Granulocytes: 0 %
Lymphocytes Absolute: 2.5 10*3/uL (ref 0.7–3.1)
Lymphs: 51 %
MCH: 31.2 pg (ref 26.6–33.0)
MCHC: 35.3 g/dL (ref 31.5–35.7)
MCV: 88 fL (ref 79–97)
Monocytes Absolute: 0.3 10*3/uL (ref 0.1–0.9)
Monocytes: 7 %
Neutrophils Absolute: 2 10*3/uL (ref 1.4–7.0)
Neutrophils: 40 %
Platelets: 360 10*3/uL (ref 150–450)
RBC: 4.52 x10E6/uL (ref 4.14–5.80)
RDW: 12 % (ref 11.6–15.4)
WBC: 4.9 10*3/uL (ref 3.4–10.8)

## 2018-10-27 ENCOUNTER — Encounter: Payer: Self-pay | Admitting: Internal Medicine

## 2019-06-09 DIAGNOSIS — Z20828 Contact with and (suspected) exposure to other viral communicable diseases: Secondary | ICD-10-CM | POA: Diagnosis not present

## 2019-06-15 ENCOUNTER — Other Ambulatory Visit: Payer: Self-pay

## 2019-06-15 ENCOUNTER — Encounter: Payer: Self-pay | Admitting: Internal Medicine

## 2019-06-15 ENCOUNTER — Ambulatory Visit
Admission: RE | Admit: 2019-06-15 | Discharge: 2019-06-15 | Disposition: A | Discharge status: Home / Self Care | Payer: BC Managed Care – PPO | Source: Ambulatory Visit | Attending: Internal Medicine | Admitting: Internal Medicine

## 2019-06-15 ENCOUNTER — Ambulatory Visit (INDEPENDENT_AMBULATORY_CARE_PROVIDER_SITE_OTHER): Payer: BC Managed Care – PPO | Admitting: Internal Medicine

## 2019-06-15 VITALS — BP 120/80 | HR 75 | Ht 71.0 in | Wt 234.0 lb

## 2019-06-15 DIAGNOSIS — N4340 Spermatocele of epididymis, unspecified: Secondary | ICD-10-CM | POA: Insufficient documentation

## 2019-06-15 DIAGNOSIS — J4520 Mild intermittent asthma, uncomplicated: Secondary | ICD-10-CM

## 2019-06-15 DIAGNOSIS — Z0000 Encounter for general adult medical examination without abnormal findings: Secondary | ICD-10-CM

## 2019-06-15 DIAGNOSIS — Z23 Encounter for immunization: Secondary | ICD-10-CM | POA: Diagnosis not present

## 2019-06-15 DIAGNOSIS — E782 Mixed hyperlipidemia: Secondary | ICD-10-CM

## 2019-06-15 DIAGNOSIS — N5089 Other specified disorders of the male genital organs: Secondary | ICD-10-CM | POA: Diagnosis not present

## 2019-06-15 DIAGNOSIS — F411 Generalized anxiety disorder: Secondary | ICD-10-CM | POA: Diagnosis not present

## 2019-06-15 DIAGNOSIS — N4341 Spermatocele of epididymis, single: Secondary | ICD-10-CM | POA: Diagnosis not present

## 2019-06-15 LAB — POCT URINALYSIS DIPSTICK
Bilirubin, UA: NEGATIVE
Glucose, UA: NEGATIVE
Ketones, UA: NEGATIVE
Leukocytes, UA: NEGATIVE
Nitrite, UA: NEGATIVE
Protein, UA: NEGATIVE
Spec Grav, UA: 1.025 (ref 1.010–1.025)
Urobilinogen, UA: 0.2 E.U./dL
pH, UA: 5 (ref 5.0–8.0)

## 2019-06-15 MED ORDER — CLONAZEPAM 0.5 MG PO TABS: 0.5000 mg | ORAL_TABLET | Freq: Two times a day (BID) | ORAL | 0 refills | Status: DC | PRN

## 2019-06-15 MED ORDER — ALBUTEROL SULFATE HFA 108 (90 BASE) MCG/ACT IN AERS: 2.0000 | INHALATION_SPRAY | Freq: Four times a day (QID) | RESPIRATORY_TRACT | 2 refills | Status: DC | PRN

## 2019-06-15 NOTE — Progress Notes (Signed)
Date:  06/15/2019   Name:  Richard Paul   DOB:  1986/03/27   MRN:  [id number]   Chief Complaint: Annual Exam (Flu shot. ) Richard Paul is a 33 y.o. male who presents today for his Complete Annual Exam. He feels well. He reports exercising - home gym three days a week. He reports he is sleeping fairly well.   Tdap 05/2018  Asthma He complains of wheezing. There is no shortness of breath. This is a recurrent problem. The problem occurs intermittently. The problem has been unchanged. Pertinent negatives include no appetite change, chest pain, headaches, myalgias or trouble swallowing. His symptoms are alleviated by beta-agonist. His past medical history is significant for asthma.  Anxiety Presents for follow-up visit. Symptoms include nervous/anxious behavior and restlessness. Patient reports no chest pain, dizziness, palpitations or shortness of breath. Symptoms occur occasionally. The quality of sleep is fair.   His past medical history is significant for asthma. Compliance with medications: uses Clonazepam PRN only.  Testicular mass - on the upper left testicle, has been present for some time.  He says it was to be evaluated several years ago but he never had follow up.  He believes it was unchanged.   Lab Results  Component Value Date   CREATININE 1.08 06/09/2018   BUN 14 06/09/2018   NA 141 06/09/2018   K 4.5 06/09/2018   CL 101 06/09/2018   CO2 24 06/09/2018   Lab Results  Component Value Date   CHOL 281 (H) 06/09/2018   HDL 63 06/09/2018   LDLCALC 192 (H) 06/09/2018   TRIG 130 06/09/2018   CHOLHDL 4.5 06/09/2018   No results found for: TSH No results found for: HGBA1C   Review of Systems  Constitutional: Negative for appetite change, chills, diaphoresis, fatigue and unexpected weight change.  HENT: Negative for hearing loss, tinnitus, trouble swallowing and voice change.   Eyes: Negative for visual disturbance.  Respiratory: Positive for wheezing. Negative for  choking and shortness of breath.   Cardiovascular: Negative for chest pain, palpitations and leg swelling.  Gastrointestinal: Negative for abdominal pain, blood in stool, constipation and diarrhea.  Genitourinary: Positive for testicular pain (small lump on left). Negative for difficulty urinating, dysuria and frequency.  Musculoskeletal: Negative for arthralgias, back pain and myalgias.  Skin: Negative for color change and rash.  Allergic/Immunologic: Negative for environmental allergies.  Neurological: Negative for dizziness, syncope and headaches.  Hematological: Negative for adenopathy.  Psychiatric/Behavioral: Negative for dysphoric mood and sleep disturbance. The patient is nervous/anxious.     Patient Active Problem List   Diagnosis Date Noted  . Generalized anxiety disorder 11/12/2017  . Hyperlipidemia, mixed 07/05/2016  . Heel spur, left 07/05/2016  . 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 Mild intermittent asthma without complication [id number]    No Known Allergies  Past Surgical History:  Procedure Laterality Date  . UMBILICAL HERNIA REPAIR    . WISDOM TOOTH EXTRACTION      Social History   Tobacco Use  . Smoking status: Never Smoker  . Smokeless tobacco: Never Used  Substance Use Topics  . Alcohol use: Yes    Comment: occassion   . Drug use: No     Medication list has been reviewed and updated.  Current Meds  Medication Sig  . albuterol (PROVENTIL HFA;VENTOLIN HFA) 108 (90 Base) MCG/ACT inhaler Inhale 2 puffs into the lungs every 6 (six) hours as needed.  . clonazePAM (KLONOPIN) 0.5 MG tablet Take  1 tablet (0.5 mg total) by mouth 2 (two) times daily as needed for anxiety.  . fluticasone (FLONASE) 50 MCG/ACT nasal spray SPRAY 2 SPRAYS INTO EACH NOSTRIL EVERY DAY  . ibuprofen (ADVIL,MOTRIN) 800 MG tablet Take 800 mg by mouth every 8 (eight) hours as needed.  . Multiple Vitamins-Minerals (ALIVE MENS ENERGY) TABS Take by mouth.    PHQ 2/9  Scores 06/15/2019 08/28/2018 06/09/2018 11/12/2017  PHQ - 2 Score 2 0 0 0  PHQ- 9 Score 4 - - -   GAD 7 : Generalized Anxiety Score 06/15/2019 11/12/2017  Nervous, Anxious, on Edge 3 2  Control/stop worrying 1 2  Worry too much - different things 0 2  Trouble relaxing 2 0  Restless 0 0  Easily annoyed or irritable 3 2  Afraid - awful might happen 1 1  Total GAD 7 Score 10 9  Anxiety Difficulty Somewhat difficult Not difficult at all     BP Readings from Last 3 Encounters:  06/15/19 132/82  08/28/18 116/80  06/09/18 132/82    Physical Exam Vitals signs and nursing note reviewed.  Constitutional:      Appearance: Normal appearance. He is well-developed.  HENT:     Head: Normocephalic.     Right Ear: Tympanic membrane, ear canal and external ear normal.     Left Ear: Tympanic membrane, ear canal and external ear normal.     Nose: Nose normal.     Mouth/Throat:     Pharynx: Uvula midline.  Eyes:     Conjunctiva/sclera: Conjunctivae normal.     Pupils: Pupils are equal, round, and reactive to light.  Neck:     Musculoskeletal: Normal range of motion and neck supple.     Thyroid: No thyromegaly.     Vascular: No carotid bruit.  Cardiovascular:     Rate and Rhythm: Normal rate and regular rhythm.     Pulses: Normal pulses.     Heart sounds: Normal heart sounds. No murmur.  Pulmonary:     Effort: Pulmonary effort is normal.     Breath sounds: Normal breath sounds. No wheezing.  Chest:     Breasts:        Right: No mass.        Left: No mass.  Abdominal:     General: Bowel sounds are normal.     Palpations: Abdomen is soft.     Tenderness: There is no abdominal tenderness.  Genitourinary:    Penis: Normal and uncircumcised.     Musculoskeletal: Normal range of motion.     Right lower leg: No edema.     Left lower leg: No edema.  Lymphadenopathy:     Cervical: No cervical adenopathy.  Skin:    General: Skin is warm and dry.  Neurological:     Mental Status: He  is alert and oriented to person, place, and time.     Deep Tendon Reflexes: Reflexes are normal and symmetric.  Psychiatric:        Speech: Speech normal.        Behavior: Behavior normal.        Thought Content: Thought content normal.        Judgment: Judgment normal.     Wt Readings from Last 3 Encounters:  06/15/19 234 lb (106.1 kg)  08/28/18 230 lb (104.3 kg)  06/09/18 241 lb (109.3 kg)    BP 132/82   Pulse 75   Ht 5\' 11"  (1.803 m)   Wt 234 lb (106.1  kg)   SpO2 96%   BMI 32.64 kg/m   Assessment and Plan: 1. Annual physical exam Normal exam except for weight Continue exercise, work on diet - CBC with Differential/Platelet - Comprehensive metabolic panel - POCT urinalysis dipstick  2. Mild intermittent asthma without complication Mild stable intermittent sx not requiring daily controlled medications at this time - albuterol (VENTOLIN HFA) 108 (90 Base) MCG/ACT inhaler; Inhale 2 puffs into the lungs every 6 (six) hours as needed.  Dispense: 18 g; Refill: 2  3. Hyperlipidemia, mixed Check labs and advise - Lipid panel  4. Generalized anxiety disorder Mild intermittent sx - pt reports doing well and appears clinically stable Continue clonazepam PRN severe anxiety sx - clonazePAM (KLONOPIN) 0.5 MG tablet; Take 1 tablet (0.5 mg total) by mouth 2 (two) times daily as needed for anxiety.  Dispense: 15 tablet; Refill: 0  5. Testicular mass Pt states mass has been present for some time but does not cause any symptoms Recommend [place] to further evaluate - US SCROTUM W/DOPPLER; Future  6. Need for immunization against influenza - Flu Vaccine QUAD 36+ mos IM   Partially dictated using Editor, commissioning. Any errors are unintentional.  Halina Maidens, MD [facility]  06/15/2019

## 2019-06-16 ENCOUNTER — Encounter: Payer: Self-pay | Admitting: Internal Medicine

## 2019-06-16 LAB — CBC WITH DIFFERENTIAL/PLATELET
Basophils Absolute: 0 10*3/uL (ref 0.0–0.2)
Basos: 1 %
EOS (ABSOLUTE): 0.1 10*3/uL (ref 0.0–0.4)
Eos: 1 %
Hematocrit: 41.2 % (ref 37.5–51.0)
Hemoglobin: 13.8 g/dL (ref 13.0–17.7)
Immature Grans (Abs): 0 10*3/uL (ref 0.0–0.1)
Immature Granulocytes: 0 %
Lymphocytes Absolute: 2.6 10*3/uL (ref 0.7–3.1)
Lymphs: 49 %
MCH: 30.4 pg (ref 26.6–33.0)
MCHC: 33.5 g/dL (ref 31.5–35.7)
MCV: 91 fL (ref 79–97)
Monocytes Absolute: 0.4 10*3/uL (ref 0.1–0.9)
Monocytes: 7 %
Neutrophils Absolute: 2.3 10*3/uL (ref 1.4–7.0)
Neutrophils: 42 %
Platelets: 262 10*3/uL (ref 150–450)
RBC: 4.54 x10E6/uL (ref 4.14–5.80)
RDW: 12.3 % (ref 11.6–15.4)
WBC: 5.4 10*3/uL (ref 3.4–10.8)

## 2019-06-16 LAB — LIPID PANEL
Chol/HDL Ratio: 5 ratio (ref 0.0–5.0)
Cholesterol, Total: 249 mg/dL — ABNORMAL HIGH (ref 100–199)
HDL: 50 mg/dL (ref >39)
LDL Chol Calc (NIH): 169 mg/dL — ABNORMAL HIGH (ref 0–99)
Triglycerides: 165 mg/dL — ABNORMAL HIGH (ref 0–149)
VLDL Cholesterol Cal: 30 mg/dL (ref 5–40)

## 2019-06-16 LAB — COMPREHENSIVE METABOLIC PANEL
ALT: 31 IU/L (ref 0–44)
AST: 22 IU/L (ref 0–40)
Albumin/Globulin Ratio: 1.8 (ref 1.2–2.2)
Albumin: 4.4 g/dL (ref 4.0–5.0)
Alkaline Phosphatase: 86 IU/L (ref 39–117)
BUN/Creatinine Ratio: 11 (ref 9–20)
BUN: 14 mg/dL (ref 6–20)
Bilirubin Total: <0.2 mg/dL (ref 0.0–1.2)
CO2: 24 mmol/L (ref 20–29)
Calcium: 9.5 mg/dL (ref 8.7–10.2)
Chloride: 105 mmol/L (ref 96–106)
Creatinine, Ser: 1.22 mg/dL (ref 0.76–1.27)
GFR calc Af Amer: 88 mL/min/{1.73_m2} (ref >59)
GFR calc non Af Amer: 76 mL/min/{1.73_m2} (ref >59)
Globulin, Total: 2.4 g/dL (ref 1.5–4.5)
Glucose: 84 mg/dL (ref 65–99)
Potassium: 4.5 mmol/L (ref 3.5–5.2)
Sodium: 143 mmol/L (ref 134–144)
Total Protein: 6.8 g/dL (ref 6.0–8.5)

## 2019-06-16 NOTE — Telephone Encounter (Signed)
Do you recommend red yeast rice?

## 2019-07-19 ENCOUNTER — Encounter: Payer: Self-pay | Admitting: Internal Medicine

## 2019-10-06 DIAGNOSIS — Z23 Encounter for immunization: Secondary | ICD-10-CM | POA: Diagnosis not present

## 2019-10-27 ENCOUNTER — Encounter: Payer: Self-pay | Admitting: Internal Medicine

## 2019-11-01 ENCOUNTER — Other Ambulatory Visit: Payer: Self-pay

## 2019-11-01 ENCOUNTER — Encounter: Payer: Self-pay | Admitting: Internal Medicine

## 2019-11-01 ENCOUNTER — Ambulatory Visit: Payer: BC Managed Care – PPO | Admitting: Internal Medicine

## 2019-11-01 VITALS — BP 128/70 | HR 74 | Temp 98.0°F | Ht 71.0 in | Wt 235.0 lb

## 2019-11-01 DIAGNOSIS — J4520 Mild intermittent asthma, uncomplicated: Secondary | ICD-10-CM | POA: Diagnosis not present

## 2019-11-01 MED ORDER — ALBUTEROL SULFATE HFA 108 (90 BASE) MCG/ACT IN AERS: 2.0000 | INHALATION_SPRAY | Freq: Four times a day (QID) | RESPIRATORY_TRACT | 2 refills | Status: DC | PRN

## 2019-11-01 NOTE — Progress Notes (Signed)
Date:  11/01/2019   Name:  Richard Paul   DOB:  23-Nov-1985   MRN:  [id number]   Chief Complaint: Asthma (Allergies and asthma getting worse due to the increase in pollen. Coming in today to discuss trying a maintenance inhaler. )  Asthma He complains of chest tightness, cough, shortness of breath and wheezing. There is no hemoptysis or sputum production. This is a recurrent problem. The current episode started 1 to 4 weeks ago. The problem occurs 2 to 4 times per day (using albuterol 3-4 times per day). The cough is non-productive. Pertinent negatives include no chest pain, fever or headaches. His symptoms are alleviated by beta-agonist. He reports moderate improvement on treatment. His past medical history is significant for asthma.    Lab Results  Component Value Date   CREATININE 1.22 06/15/2019   BUN 14 06/15/2019   NA 143 06/15/2019   K 4.5 06/15/2019   CL 105 06/15/2019   CO2 24 06/15/2019   Lab Results  Component Value Date   CHOL 249 (H) 06/15/2019   HDL 50 06/15/2019   LDLCALC 169 (H) 06/15/2019   TRIG 165 (H) 06/15/2019   CHOLHDL 5.0 06/15/2019   No results found for: TSH No results found for: HGBA1C Lab Results  Component Value Date   WBC 5.4 06/15/2019   HGB 13.8 06/15/2019   HCT 41.2 06/15/2019   MCV 91 06/15/2019   PLT 262 06/15/2019   Lab Results  Component Value Date   ALT 31 06/15/2019   AST 22 06/15/2019   ALKPHOS 86 06/15/2019   BILITOT <0.2 06/15/2019     Review of Systems  Constitutional: Negative for chills, fatigue, fever and unexpected weight change.  Respiratory: Positive for cough, shortness of breath and wheezing. Negative for hemoptysis and sputum production.   Cardiovascular: Negative for chest pain and palpitations.  Neurological: Negative for dizziness, light-headedness and headaches.    Patient Active Problem List   Diagnosis Date Noted  . Testicular mass 06/15/2019  . Generalized anxiety disorder 11/12/2017  .  Hyperlipidemia, mixed 07/05/2016  . Heel spur, left 07/05/2016  . 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 Mild intermittent asthma without complication [id number]    No Known Allergies  Past Surgical History:  Procedure Laterality Date  . UMBILICAL HERNIA REPAIR    . WISDOM TOOTH EXTRACTION      Social History   Tobacco Use  . Smoking status: Never Smoker  . Smokeless tobacco: Never Used  Substance Use Topics  . Alcohol use: Yes    Comment: occassion   . Drug use: No     Medication list has been reviewed and updated.  Current Meds  Medication Sig  . albuterol (VENTOLIN HFA) 108 (90 Base) MCG/ACT inhaler Inhale 2 puffs into the lungs every 6 (six) hours as needed.  . clonazePAM (KLONOPIN) 0.5 MG tablet Take 1 tablet (0.5 mg total) by mouth 2 (two) times daily as needed for anxiety.  . fluticasone (FLONASE) 50 MCG/ACT nasal spray SPRAY 2 SPRAYS INTO EACH NOSTRIL EVERY DAY  . ibuprofen (ADVIL,MOTRIN) 800 MG tablet Take 800 mg by mouth every 8 (eight) hours as needed.  . Multiple Vitamins-Minerals (ALIVE MENS ENERGY) TABS Take by mouth.  . [DISCONTINUED] albuterol (VENTOLIN HFA) 108 (90 Base) MCG/ACT inhaler Inhale 2 puffs into the lungs every 6 (six) hours as needed.    PHQ 2/9 Scores 11/01/2019 06/15/2019 08/28/2018 06/09/2018  PHQ - 2 Score 2 2 0 0  PHQ-  9 Score 3 4 - -    BP Readings from Last 3 Encounters:  11/01/19 128/70  06/15/19 120/80  08/28/18 116/80    Physical Exam Vitals and nursing note reviewed.  Constitutional:      General: He is not in acute distress.    Appearance: Normal appearance. He is well-developed.  HENT:     Head: Normocephalic and atraumatic.  Cardiovascular:     Rate and Rhythm: Normal rate and regular rhythm.     Pulses: Normal pulses.     Heart sounds: No murmur.  Pulmonary:     Effort: Pulmonary effort is normal. No respiratory distress.     Breath sounds: Normal breath sounds. No wheezing or rhonchi.    Musculoskeletal:        General: Normal range of motion.     Cervical back: Normal range of motion.  Lymphadenopathy:     Cervical: No cervical adenopathy.  Skin:    General: Skin is warm and dry.     Findings: No rash.  Neurological:     Mental Status: He is alert and oriented to person, place, and time.  Psychiatric:        Behavior: Behavior normal.        Thought Content: Thought content normal.     Wt Readings from Last 3 Encounters:  11/01/19 235 lb (106.6 kg)  06/15/19 234 lb (106.1 kg)  08/28/18 230 lb (104.3 kg)    BP 128/70   Pulse 74   Temp 98 F (36.7 C) (Oral)   Ht 5\' 11"  (1.803 m)   Wt 235 lb (106.6 kg)   SpO2 96%   BMI 32.78 kg/m   Assessment and Plan: 1. Mild intermittent asthma without complication Suspect seasonal worsening of asthma Continue Ventolin PRN Add Asmanex 200 mcg - 1-2 puffs daily (sample given) - albuterol (VENTOLIN HFA) 108 (90 Base) MCG/ACT inhaler; Inhale 2 puffs into the lungs every 6 (six) hours as needed.  Dispense: 18 g; Refill: 2   Partially dictated using Editor, commissioning. Any errors are unintentional.  Halina Maidens, MD [facility]  11/01/2019

## 2020-06-12 ENCOUNTER — Encounter: Payer: Self-pay | Admitting: Internal Medicine

## 2020-06-15 ENCOUNTER — Ambulatory Visit (INDEPENDENT_AMBULATORY_CARE_PROVIDER_SITE_OTHER): Payer: BC Managed Care – PPO | Admitting: Internal Medicine

## 2020-06-15 ENCOUNTER — Encounter: Payer: Self-pay | Admitting: Internal Medicine

## 2020-06-15 ENCOUNTER — Other Ambulatory Visit: Payer: Self-pay

## 2020-06-15 VITALS — BP 138/82 | HR 87 | Temp 97.7°F | Ht 71.0 in | Wt 238.0 lb

## 2020-06-15 DIAGNOSIS — M76891 Other specified enthesopathies of right lower limb, excluding foot: Secondary | ICD-10-CM | POA: Diagnosis not present

## 2020-06-15 DIAGNOSIS — F411 Generalized anxiety disorder: Secondary | ICD-10-CM

## 2020-06-15 DIAGNOSIS — J4520 Mild intermittent asthma, uncomplicated: Secondary | ICD-10-CM

## 2020-06-15 DIAGNOSIS — Z0000 Encounter for general adult medical examination without abnormal findings: Secondary | ICD-10-CM

## 2020-06-15 DIAGNOSIS — E782 Mixed hyperlipidemia: Secondary | ICD-10-CM

## 2020-06-15 DIAGNOSIS — Z1159 Encounter for screening for other viral diseases: Secondary | ICD-10-CM

## 2020-06-15 DIAGNOSIS — R3121 Asymptomatic microscopic hematuria: Secondary | ICD-10-CM | POA: Diagnosis not present

## 2020-06-15 LAB — POCT URINALYSIS DIPSTICK
Bilirubin, UA: NEGATIVE
Glucose, UA: NEGATIVE
Ketones, UA: NEGATIVE
Leukocytes, UA: NEGATIVE
Nitrite, UA: NEGATIVE
Protein, UA: NEGATIVE
Spec Grav, UA: 1.025 (ref 1.010–1.025)
Urobilinogen, UA: 0.2 E.U./dL
pH, UA: 6 (ref 5.0–8.0)

## 2020-06-15 NOTE — Progress Notes (Signed)
Date:  06/15/2020   Name:  Richard Paul   DOB:  [birth year]   MRN:  [id number]   Chief Complaint: Annual Exam and Flu Vaccine  Richard Paul is a 34 y.o. male who presents today for his Complete Annual Exam. He feels well. He reports exercising 3x times a week works out for and hour. He reports he is sleeping fairly well. He had his covid vaccine booster 5 days ago - his arm was slightly sore but he also noted swelling and tenderness in his left axilla.   Immunization History  Administered Date(s) Administered  . Influenza,inj,Quad PF,6+ Mos 08/28/2018, 06/15/2019  . Janssen (J&J) SARS-COV-2 Vaccination 10/06/2019  . Moderna SARS-COVID-2 Vaccination 06/10/2020  . Tdap 06/09/2018    Asthma There is no shortness of breath or wheezing. This is a recurrent problem. The problem occurs intermittently. Pertinent negatives include no appetite change, chest pain, headaches, myalgias or trouble swallowing. His symptoms are alleviated by beta-agonist (using albuterol about once a week). His past medical history is significant for asthma.    Lab Results  Component Value Date   CREATININE 1.22 06/15/2019   BUN 14 06/15/2019   NA 143 06/15/2019   K 4.5 06/15/2019   CL 105 06/15/2019   CO2 24 06/15/2019   Lab Results  Component Value Date   CHOL 249 (H) 06/15/2019   HDL 50 06/15/2019   LDLCALC 169 (H) 06/15/2019   TRIG 165 (H) 06/15/2019   CHOLHDL 5.0 06/15/2019   No results found for: TSH No results found for: HGBA1C Lab Results  Component Value Date   WBC 5.4 06/15/2019   HGB 13.8 06/15/2019   HCT 41.2 06/15/2019   MCV 91 06/15/2019   PLT 262 06/15/2019   Lab Results  Component Value Date   ALT 31 06/15/2019   AST 22 06/15/2019   ALKPHOS 86 06/15/2019   BILITOT <0.2 06/15/2019     Review of Systems  Constitutional: Negative for appetite change, chills, diaphoresis, fatigue and unexpected weight change.  HENT: Negative for hearing loss, tinnitus, trouble swallowing  and voice change.   Eyes: Negative for visual disturbance.  Respiratory: Negative for choking, shortness of breath and wheezing.   Cardiovascular: Negative for chest pain, palpitations and leg swelling.  Gastrointestinal: Negative for abdominal pain, blood in stool, constipation and diarrhea.  Genitourinary: Negative for difficulty urinating, dysuria and frequency.  Musculoskeletal: Positive for arthralgias (right knee). Negative for back pain and myalgias.  Skin: Negative for color change and rash.  Neurological: Negative for dizziness, syncope and headaches.  Hematological: Positive for adenopathy (left axilla feels sore and swollen since vaccine).  Psychiatric/Behavioral: Negative for dysphoric mood and sleep disturbance. The patient is not nervous/anxious.     Patient Active Problem List   Diagnosis Date Noted  . Spermatocele 06/15/2019  . Generalized anxiety disorder 11/12/2017  . Hyperlipidemia, mixed 07/05/2016  . Heel spur, left 07/05/2016  . 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 Mild intermittent asthma without complication 24/40/1027    No Known Allergies  Past Surgical History:  Procedure Laterality Date  . UMBILICAL HERNIA REPAIR    . WISDOM TOOTH EXTRACTION      Social History   Tobacco Use  . Smoking status: Never Smoker  . Smokeless tobacco: Never Used  Substance Use Topics  . Alcohol use: Yes    Comment: occassion   . Drug use: No     Medication list has been reviewed and updated.  Current  Meds  Medication Sig  . albuterol (VENTOLIN HFA) 108 (90 Base) MCG/ACT inhaler Inhale 2 puffs into the lungs every 6 (six) hours as needed.  . clonazePAM (KLONOPIN) 0.5 MG tablet Take 1 tablet (0.5 mg total) by mouth 2 (two) times daily as needed for anxiety.  . fluticasone (FLONASE) 50 MCG/ACT nasal spray SPRAY 2 SPRAYS INTO EACH NOSTRIL EVERY DAY  . ibuprofen (ADVIL,MOTRIN) 800 MG tablet Take 800 mg by mouth every 8 (eight) hours as needed.  .  Multiple Vitamins-Minerals (ALIVE MENS ENERGY) TABS Take by mouth.    PHQ 2/9 Scores 06/15/2020 11/01/2019 06/15/2019 08/28/2018  PHQ - 2 Score 0 2 2 0  PHQ- 9 Score 0 3 4 -    GAD 7 : Generalized Anxiety Score 06/15/2020 11/01/2019 06/15/2019 11/12/2017  Nervous, Anxious, on Edge 0 1 3 2   Control/stop worrying 0 1 1 2   Worry too much - different things 0 1 0 2  Trouble relaxing 0 0 2 0  Restless 0 0 0 0  Easily annoyed or irritable 0 3 3 2   Afraid - awful might happen 0 1 1 1   Total GAD 7 Score 0 7 10 9   Anxiety Difficulty - Not difficult at all Somewhat difficult Not difficult at all    BP Readings from Last 3 Encounters:  06/15/20 138/82  11/01/19 128/70  06/15/19 120/80    Physical Exam Vitals and nursing note reviewed.  Constitutional:      Appearance: Normal appearance. He is well-developed.  HENT:     Head: Normocephalic.     Right Ear: Tympanic membrane, ear canal and external ear normal.     Left Ear: Tympanic membrane, ear canal and external ear normal.     Nose: Nose normal.     Mouth/Throat:     Pharynx: Uvula midline.  Eyes:     Conjunctiva/sclera: Conjunctivae normal.     Pupils: Pupils are equal, round, and reactive to light.  Neck:     Thyroid: No thyromegaly.     Vascular: No carotid bruit.  Cardiovascular:     Rate and Rhythm: Normal rate and regular rhythm.     Pulses: Normal pulses.     Heart sounds: Normal heart sounds.  Pulmonary:     Effort: Pulmonary effort is normal.     Breath sounds: Normal breath sounds. No wheezing.  Chest:     Breasts:        Right: No mass.        Left: No mass.  Abdominal:     General: Bowel sounds are normal.     Palpations: Abdomen is soft.     Tenderness: There is no abdominal tenderness.  Musculoskeletal:        General: Normal range of motion.     Cervical back: Normal range of motion and neck supple.     Right knee: No effusion, erythema or crepitus. Tenderness (over medial joint line) present.     Left  knee: No effusion, erythema or crepitus. No tenderness.  Lymphadenopathy:     Cervical: No cervical adenopathy.  Skin:    General: Skin is warm and dry.  Neurological:     Mental Status: He is alert and oriented to person, place, and time.     Deep Tendon Reflexes: Reflexes are normal and symmetric.  Psychiatric:        Speech: Speech normal.        Behavior: Behavior normal.        Thought Content:  Thought content normal.        Judgment: Judgment normal.     Wt Readings from Last 3 Encounters:  06/15/20 238 lb (108 kg)  11/01/19 235 lb (106.6 kg)  06/15/19 234 lb (106.1 kg)    BP 138/82   Pulse 87   Temp 97.7 F (36.5 C) (Oral)   Ht 5\' 11"  (1.803 m)   Wt 238 lb (108 kg)   SpO2 94%   BMI 33.19 kg/m   Assessment and Plan: 1. Annual physical exam Exam is normal except for weight. Encourage regular exercise and appropriate dietary changes. Mild lymphadenopathy after vaccination is common - pt is reassured.  Recommend postpone flu vaccine for 1-2 weeks - Comprehensive metabolic panel  2. Mild intermittent asthma without complication Stable with only occasional sx and use of inhaler Continue PRN albuterol - CBC with Differential/Platelet  3. Hyperlipidemia, mixed Continue low fat diet, exercise - Lipid panel  4. Generalized anxiety disorder Using clonazepam rarely - still has some medication left from last year  5. Need for hepatitis C screening test - Hepatitis C antibody  6. Tendonitis of knee, right Recommend ice after exercise Tylenol or Advil as needed  7. Asymptomatic microscopic hematuria UA negative today - POCT urinalysis dipstick   Partially dictated using Editor, commissioning. Any errors are unintentional.  Halina Maidens, MD [facility]  06/15/2020

## 2020-06-16 LAB — COMPREHENSIVE METABOLIC PANEL
ALT: 38 IU/L (ref 0–44)
AST: 25 IU/L (ref 0–40)
Albumin/Globulin Ratio: 1.9 (ref 1.2–2.2)
Albumin: 4.6 g/dL (ref 4.0–5.0)
Alkaline Phosphatase: 75 IU/L (ref 44–121)
BUN/Creatinine Ratio: 11 (ref 9–20)
BUN: 12 mg/dL (ref 6–20)
Bilirubin Total: 0.3 mg/dL (ref 0.0–1.2)
CO2: 22 mmol/L (ref 20–29)
Calcium: 9.6 mg/dL (ref 8.7–10.2)
Chloride: 103 mmol/L (ref 96–106)
Creatinine, Ser: 1.09 mg/dL (ref 0.76–1.27)
GFR calc Af Amer: 100 mL/min/{1.73_m2} (ref >59)
GFR calc non Af Amer: 87 mL/min/{1.73_m2} (ref >59)
Globulin, Total: 2.4 g/dL (ref 1.5–4.5)
Glucose: 86 mg/dL (ref 65–99)
Potassium: 4.3 mmol/L (ref 3.5–5.2)
Sodium: 140 mmol/L (ref 134–144)
Total Protein: 7 g/dL (ref 6.0–8.5)

## 2020-06-16 LAB — CBC WITH DIFFERENTIAL/PLATELET
Basophils Absolute: 0 10*3/uL (ref 0.0–0.2)
Basos: 0 %
EOS (ABSOLUTE): 0.1 10*3/uL (ref 0.0–0.4)
Eos: 1 %
Hematocrit: 42.7 % (ref 37.5–51.0)
Hemoglobin: 14.4 g/dL (ref 13.0–17.7)
Immature Grans (Abs): 0 10*3/uL (ref 0.0–0.1)
Immature Granulocytes: 0 %
Lymphocytes Absolute: 2.2 10*3/uL (ref 0.7–3.1)
Lymphs: 46 %
MCH: 30.8 pg (ref 26.6–33.0)
MCHC: 33.7 g/dL (ref 31.5–35.7)
MCV: 91 fL (ref 79–97)
Monocytes Absolute: 0.4 10*3/uL (ref 0.1–0.9)
Monocytes: 8 %
Neutrophils Absolute: 2.1 10*3/uL (ref 1.4–7.0)
Neutrophils: 45 %
Platelets: 259 10*3/uL (ref 150–450)
RBC: 4.68 x10E6/uL (ref 4.14–5.80)
RDW: 12.3 % (ref 11.6–15.4)
WBC: 4.7 10*3/uL (ref 3.4–10.8)

## 2020-06-16 LAB — LIPID PANEL
Chol/HDL Ratio: 5.1 ratio — ABNORMAL HIGH (ref 0.0–5.0)
Cholesterol, Total: 250 mg/dL — ABNORMAL HIGH (ref 100–199)
HDL: 49 mg/dL (ref >39)
LDL Chol Calc (NIH): 175 mg/dL — ABNORMAL HIGH (ref 0–99)
Triglycerides: 142 mg/dL (ref 0–149)
VLDL Cholesterol Cal: 26 mg/dL (ref 5–40)

## 2020-06-16 LAB — HEPATITIS C ANTIBODY: Hep C Virus Ab: <0.1 s/co ratio (ref 0.0–0.9)

## 2020-08-04 DIAGNOSIS — Z20822 Contact with and (suspected) exposure to covid-19: Secondary | ICD-10-CM | POA: Diagnosis not present

## 2020-09-18 DIAGNOSIS — M67841 Other specified disorders of synovium, right hand: Secondary | ICD-10-CM | POA: Diagnosis not present

## 2020-10-30 DIAGNOSIS — G5603 Carpal tunnel syndrome, bilateral upper limbs: Secondary | ICD-10-CM | POA: Diagnosis not present

## 2021-06-19 ENCOUNTER — Other Ambulatory Visit: Payer: Self-pay

## 2021-06-19 ENCOUNTER — Ambulatory Visit (INDEPENDENT_AMBULATORY_CARE_PROVIDER_SITE_OTHER): Payer: BC Managed Care – PPO | Admitting: Internal Medicine

## 2021-06-19 ENCOUNTER — Encounter: Payer: Self-pay | Admitting: Internal Medicine

## 2021-06-19 VITALS — BP 132/78 | HR 87 | Ht 71.0 in | Wt 238.4 lb

## 2021-06-19 DIAGNOSIS — J4520 Mild intermittent asthma, uncomplicated: Secondary | ICD-10-CM | POA: Diagnosis not present

## 2021-06-19 DIAGNOSIS — F411 Generalized anxiety disorder: Secondary | ICD-10-CM | POA: Diagnosis not present

## 2021-06-19 DIAGNOSIS — D485 Neoplasm of uncertain behavior of skin: Secondary | ICD-10-CM

## 2021-06-19 DIAGNOSIS — Z0000 Encounter for general adult medical examination without abnormal findings: Secondary | ICD-10-CM

## 2021-06-19 DIAGNOSIS — E782 Mixed hyperlipidemia: Secondary | ICD-10-CM | POA: Diagnosis not present

## 2021-06-19 DIAGNOSIS — Z23 Encounter for immunization: Secondary | ICD-10-CM

## 2021-06-19 MED ORDER — CLONAZEPAM 0.5 MG PO TABS: 0.5000 mg | ORAL_TABLET | Freq: Two times a day (BID) | ORAL | 0 refills | Status: DC | PRN

## 2021-06-19 MED ORDER — ALBUTEROL SULFATE HFA 108 (90 BASE) MCG/ACT IN AERS: 2.0000 | INHALATION_SPRAY | Freq: Four times a day (QID) | RESPIRATORY_TRACT | 2 refills | Status: DC | PRN

## 2021-06-19 NOTE — Progress Notes (Signed)
Date:  06/19/2021   Name:  Richard Paul   DOB:  Feb 22, 1986   MRN:  [id number]   Chief Complaint: Annual Exam Richard Paul is a 35 y.o. male who presents today for his Complete Annual Exam. He feels well. He reports exercising. He reports he is sleeping fairly well.   Colonoscopy: none  Immunization History  Administered Date(s) Administered   Influenza,inj,Quad PF,6+ Mos 08/28/2018, 06/15/2019   Janssen (J&J) SARS-COV-2 Vaccination 10/06/2019   Moderna Sars-Covid-2 Vaccination 06/10/2020   Tdap 06/09/2018    No results found for: PSA  Asthma There is no shortness of breath or wheezing. This is a recurrent problem. The problem occurs intermittently. Pertinent negatives include no appetite change, chest pain, headaches, myalgias or trouble swallowing. His past medical history is significant for asthma.   Lab Results  Component Value Date   CREATININE 1.09 06/15/2020   BUN 12 06/15/2020   NA 140 06/15/2020   K 4.3 06/15/2020   CL 103 06/15/2020   CO2 22 06/15/2020   Lab Results  Component Value Date   CHOL 250 (H) 06/15/2020   HDL 49 06/15/2020   LDLCALC 175 (H) 06/15/2020   TRIG 142 06/15/2020   CHOLHDL 5.1 (H) 06/15/2020   No results found for: TSH No results found for: HGBA1C Lab Results  Component Value Date   WBC 4.7 06/15/2020   HGB 14.4 06/15/2020   HCT 42.7 06/15/2020   MCV 91 06/15/2020   PLT 259 06/15/2020   Lab Results  Component Value Date   ALT 38 06/15/2020   AST 25 06/15/2020   ALKPHOS 75 06/15/2020   BILITOT 0.3 06/15/2020   No components found for: VITD  Review of Systems  Constitutional:  Negative for appetite change, chills, diaphoresis, fatigue and unexpected weight change.  HENT:  Negative for hearing loss, tinnitus, trouble swallowing and voice change.   Eyes:  Negative for visual disturbance.  Respiratory:  Negative for choking, shortness of breath and wheezing.   Cardiovascular:  Negative for chest pain, palpitations and leg  swelling.  Gastrointestinal:  Negative for abdominal pain, blood in stool, constipation and diarrhea.  Genitourinary:  Negative for difficulty urinating, dysuria, frequency and hematuria.  Musculoskeletal:  Negative for arthralgias, back pain and myalgias.  Skin:  Negative for color change and rash.  Neurological:  Negative for dizziness, syncope and headaches.  Hematological:  Negative for adenopathy.  Psychiatric/Behavioral:  Negative for dysphoric mood and sleep disturbance. The patient is nervous/anxious.    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  Current Meds  Medication Sig   albuterol (VENTOLIN HFA) 108 (90 Base) MCG/ACT inhaler Inhale 2 puffs into the lungs every 6 (six) hours as needed.   clonazePAM (KLONOPIN) 0.5 MG tablet Take 1 tablet (0.5 mg total) by mouth 2 (  two) times daily as needed for anxiety.   fluticasone (FLONASE) 50 MCG/ACT nasal spray SPRAY 2 SPRAYS INTO EACH NOSTRIL EVERY DAY   ibuprofen (ADVIL,MOTRIN) 800 MG tablet Take 800 mg by mouth every 8 (eight) hours as needed.   Multiple Vitamins-Minerals (ALIVE MENS ENERGY) TABS Take by mouth.    PHQ 2/9 Scores 06/19/2021 06/15/2020 11/01/2019 06/15/2019  PHQ - 2 Score 1 0 2 2  PHQ- 9 Score 3 0 3 4    GAD 7 : Generalized Anxiety Score 06/19/2021 06/15/2020 11/01/2019 06/15/2019  Nervous, Anxious, on Edge 1 0  1 3  Control/stop worrying 0 0 1 1  Worry too much - different things 0 0 1 0  Trouble relaxing 0 0 0 2  Restless 0 0 0 0  Easily annoyed or irritable 2 0 3 3  Afraid - awful might happen 0 0 1 1  Total GAD 7 Score 3 0 7 10  Anxiety Difficulty Not difficult at all - Not difficult at all Somewhat difficult    BP Readings from Last 3 Encounters:  06/19/21 132/78  06/15/20 138/82  11/01/19 128/70    Physical Exam Vitals and nursing note reviewed.  Constitutional:      Appearance: Normal appearance. He is well-developed.  HENT:     Head: Normocephalic.     Right Ear: Tympanic membrane, ear canal and external ear normal.     Left Ear: Tympanic membrane, ear canal and external ear normal.     Nose: Nose normal.  Eyes:     Conjunctiva/sclera: Conjunctivae normal.     Pupils: Pupils are equal, round, and reactive to light.  Neck:     Thyroid: No thyromegaly.     Vascular: No carotid bruit.  Cardiovascular:     Rate and Rhythm: Normal rate and regular rhythm.     Heart sounds: Normal heart sounds.  Pulmonary:     Effort: Pulmonary effort is normal.     Breath sounds: Normal breath sounds. No wheezing.  Chest:  Breasts:    Right: No mass.     Left: No mass.  Abdominal:     General: Bowel sounds are normal.     Palpations: Abdomen is soft.     Tenderness: There is no abdominal tenderness.  Musculoskeletal:        General: Normal range of motion.     Cervical back: Normal range of motion and neck supple.  Lymphadenopathy:     Cervical: No cervical adenopathy.  Skin:    General: Skin is warm and dry.     Comments: 1 mm dark flat lesion on palmar aspect of left index finger  Neurological:     Mental Status: He is alert and oriented to person, place, and time.     Deep Tendon Reflexes: Reflexes are normal and symmetric.  Psychiatric:        Attention and Perception: Attention normal.        Mood and Affect: Mood normal.        Thought Content: Thought content normal.     Wt Readings from Last 3 Encounters:  06/19/21 238 lb 6.4 oz (108.1 kg)  06/15/20 238 lb (108 kg)  11/01/19 235 lb (106.6 kg)    BP 132/78   Pulse 87   Ht 5\' 11"  (1.803 m)   Wt 238 lb 6.4 oz (108.1 kg)   SpO2 97%   BMI 33.25 kg/m   Assessment and Plan: 1. Annual physical exam Exam is normal except for  weight. Encourage regular exercise and appropriate dietary changes. - CBC with Differential/Platelet - Comprehensive metabolic panel - Hemoglobin A1c  2. Hyperlipidemia, mixed Continue healthy diet, regular exercise; will advise if medication is needed - Lipid panel  3. Mild intermittent asthma without complication Recommend [id number] - albuterol (VENTOLIN HFA) 108 (90 Base) MCG/ACT inhaler; Inhale 2 puffs into the lungs every 6 (six) hours as needed.  Dispense: 18 g; Refill: 2  4. Generalized anxiety disorder Occasional racing thoughts and difficulty relaxing; has uses #15 over the past 2 years - clonazePAM (KLONOPIN) 0.5 MG tablet; Take 1 tablet (0.5 mg total) by mouth 2 (two) times daily as needed for anxiety.  Dispense: 15 tablet; Refill: 0  5. Neoplasm of uncertain behavior of skin Patient will monitor and see dermatology if persistent over the next 2 weeks   Partially dictated using Dragon software. Any errors are unintentional.  Halina Maidens, MD [facility]  06/19/2021

## 2021-06-20 LAB — COMPREHENSIVE METABOLIC PANEL
ALT: 41 IU/L (ref 0–44)
AST: 26 IU/L (ref 0–40)
Albumin/Globulin Ratio: 1.9 (ref 1.2–2.2)
Albumin: 4.6 g/dL (ref 4.0–5.0)
Alkaline Phosphatase: 79 IU/L (ref 44–121)
BUN/Creatinine Ratio: 12 (ref 9–20)
BUN: 14 mg/dL (ref 6–20)
Bilirubin Total: <0.2 mg/dL (ref 0.0–1.2)
CO2: 24 mmol/L (ref 20–29)
Calcium: 9.8 mg/dL (ref 8.7–10.2)
Chloride: 102 mmol/L (ref 96–106)
Creatinine, Ser: 1.17 mg/dL (ref 0.76–1.27)
Globulin, Total: 2.4 g/dL (ref 1.5–4.5)
Glucose: 85 mg/dL (ref 70–99)
Potassium: 4.4 mmol/L (ref 3.5–5.2)
Sodium: 141 mmol/L (ref 134–144)
Total Protein: 7 g/dL (ref 6.0–8.5)
eGFR: 82 mL/min/{1.73_m2} (ref >59)

## 2021-06-20 LAB — CBC WITH DIFFERENTIAL/PLATELET
Basophils Absolute: 0 10*3/uL (ref 0.0–0.2)
Basos: 1 %
EOS (ABSOLUTE): 0 10*3/uL (ref 0.0–0.4)
Eos: 0 %
Hematocrit: 42.2 % (ref 37.5–51.0)
Hemoglobin: 14.4 g/dL (ref 13.0–17.7)
Immature Grans (Abs): 0 10*3/uL (ref 0.0–0.1)
Immature Granulocytes: 0 %
Lymphocytes Absolute: 2.4 10*3/uL (ref 0.7–3.1)
Lymphs: 46 %
MCH: 31.2 pg (ref 26.6–33.0)
MCHC: 34.1 g/dL (ref 31.5–35.7)
MCV: 91 fL (ref 79–97)
Monocytes Absolute: 0.5 10*3/uL (ref 0.1–0.9)
Monocytes: 9 %
Neutrophils Absolute: 2.4 10*3/uL (ref 1.4–7.0)
Neutrophils: 44 %
Platelets: 264 10*3/uL (ref 150–450)
RBC: 4.62 x10E6/uL (ref 4.14–5.80)
RDW: 12.7 % (ref 11.6–15.4)
WBC: 5.3 10*3/uL (ref 3.4–10.8)

## 2021-06-20 LAB — LIPID PANEL
Chol/HDL Ratio: 5.3 ratio — ABNORMAL HIGH (ref 0.0–5.0)
Cholesterol, Total: 259 mg/dL — ABNORMAL HIGH (ref 100–199)
HDL: 49 mg/dL (ref >39)
LDL Chol Calc (NIH): 184 mg/dL — ABNORMAL HIGH (ref 0–99)
Triglycerides: 142 mg/dL (ref 0–149)
VLDL Cholesterol Cal: 26 mg/dL (ref 5–40)

## 2021-06-20 LAB — HEMOGLOBIN A1C
Est. average glucose Bld gHb Est-mCnc: 120 mg/dL
Hgb A1c MFr Bld: 5.8 % — ABNORMAL HIGH (ref 4.8–5.6)

## 2021-07-10 DIAGNOSIS — Z5181 Encounter for therapeutic drug level monitoring: Secondary | ICD-10-CM | POA: Diagnosis not present

## 2021-07-10 DIAGNOSIS — R079 Chest pain, unspecified: Secondary | ICD-10-CM | POA: Diagnosis not present

## 2021-07-10 DIAGNOSIS — G479 Sleep disorder, unspecified: Secondary | ICD-10-CM | POA: Diagnosis not present

## 2021-07-10 DIAGNOSIS — Z79899 Other long term (current) drug therapy: Secondary | ICD-10-CM | POA: Diagnosis not present

## 2021-07-10 DIAGNOSIS — R0789 Other chest pain: Secondary | ICD-10-CM | POA: Diagnosis not present

## 2021-10-09 ENCOUNTER — Encounter: Payer: Self-pay | Admitting: Internal Medicine

## 2021-11-15 IMAGING — US US SCROTUM W/ DOPPLER COMPLETE
2 series · 14 of 25 positions shown · non-contrast
Comparison: None.

CLINICAL DATA: Left testicular mass

EXAM:
SCROTAL ULTRASOUND
DOPPLER ULTRASOUND OF THE TESTICLES
TECHNIQUE: Complete ultrasound examination of the testicles, epididymis, and
other scrotal structures was performed. Color and spectral Doppler
ultrasound were also utilized to evaluate blood flow to the
testicles.

[Series 1: us scrotum w/ doppler complete · 0.07mm/px · 13 of 67 slices shown (1 of 2)]
[im 1/67]
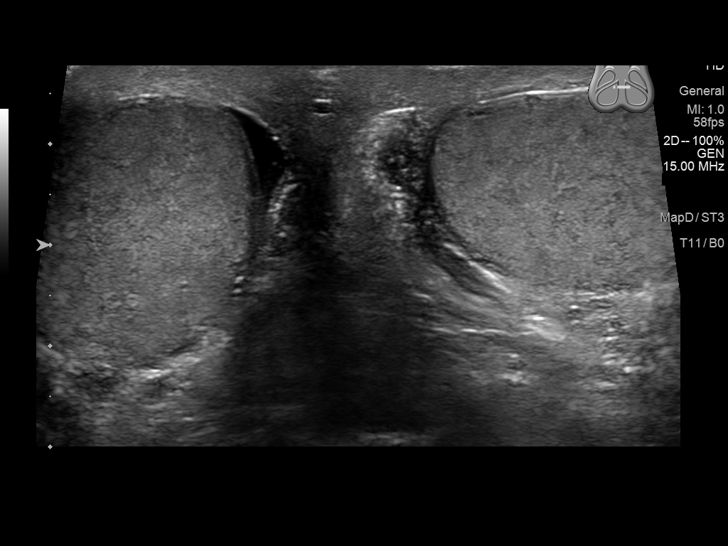
[im 6/67]
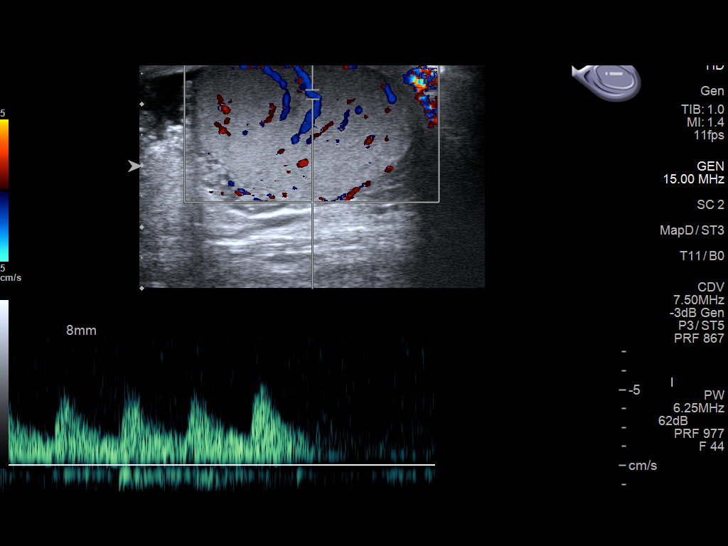
[im 12/67]
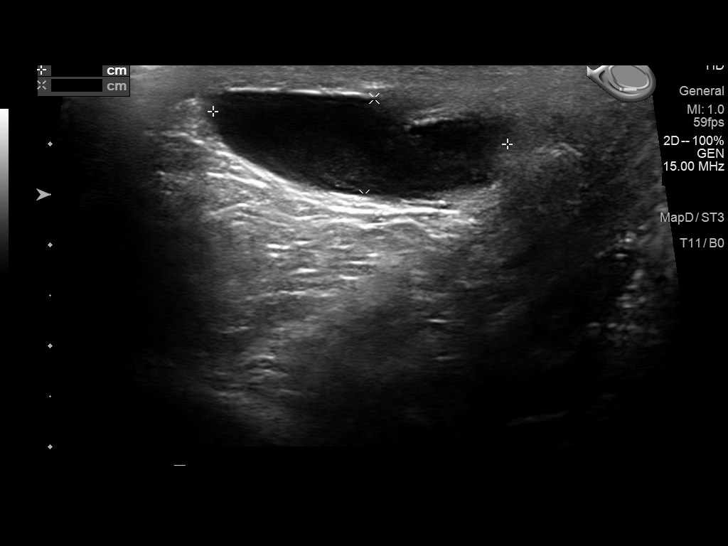
[im 18/67]
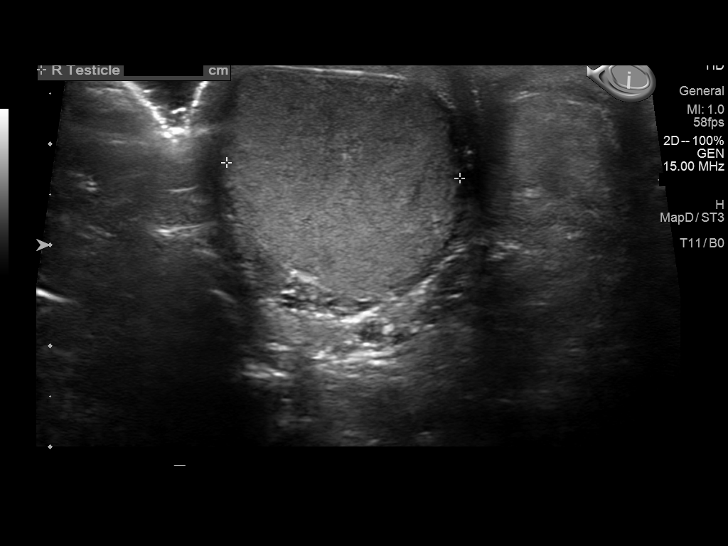
[im 23/67]
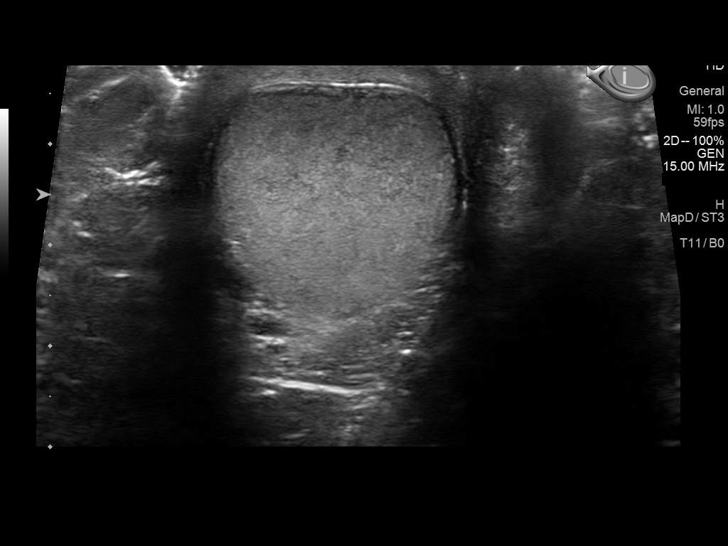
[im 26/67]
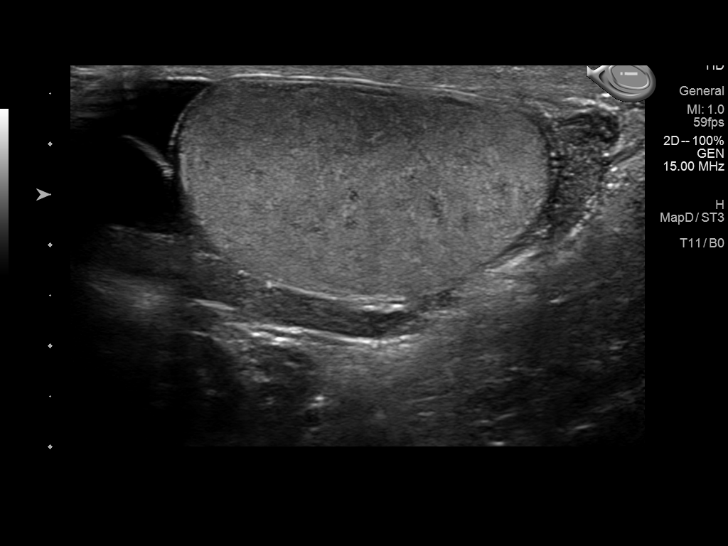
[im 32/67]
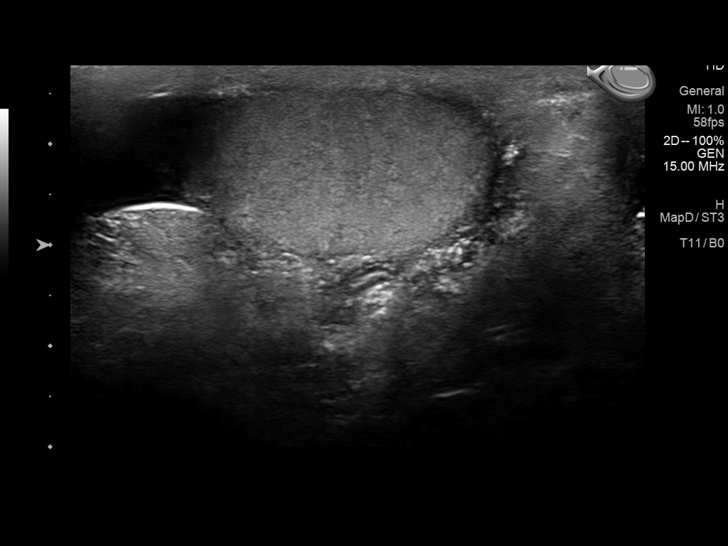
[im 38/67]
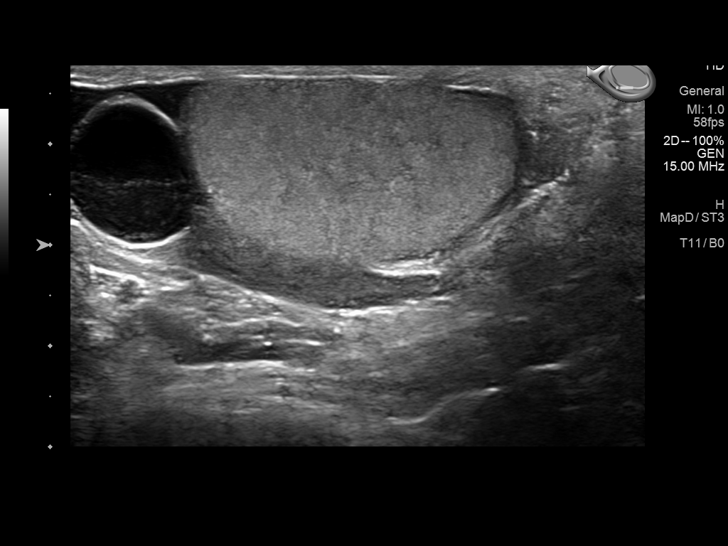
[im 44/67]
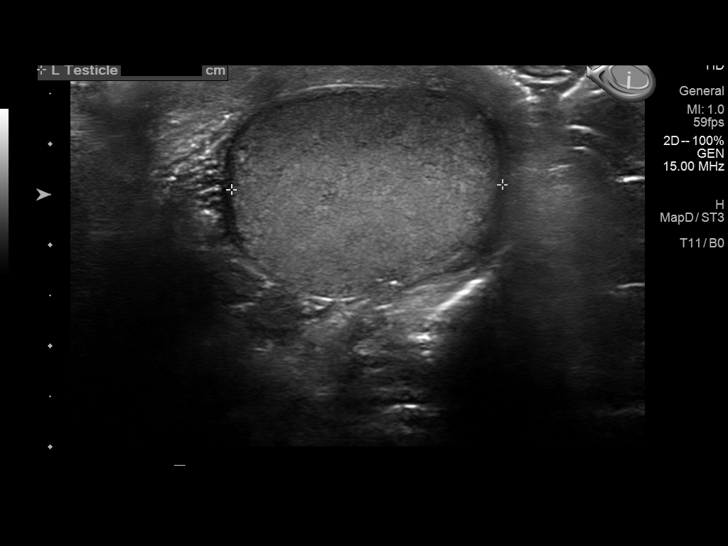
[im 46/67]
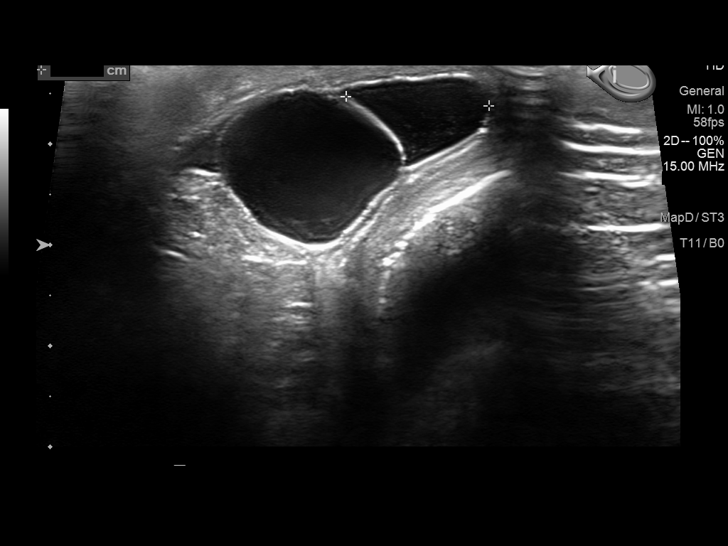
[im 52/67]
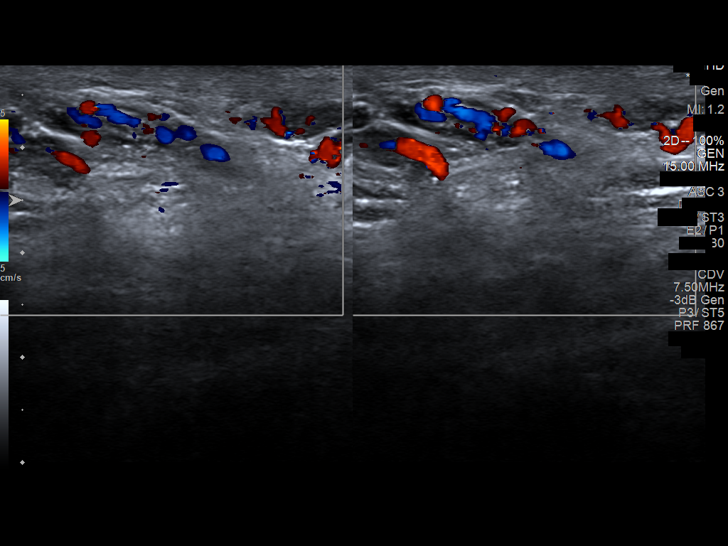
[im 58/67]
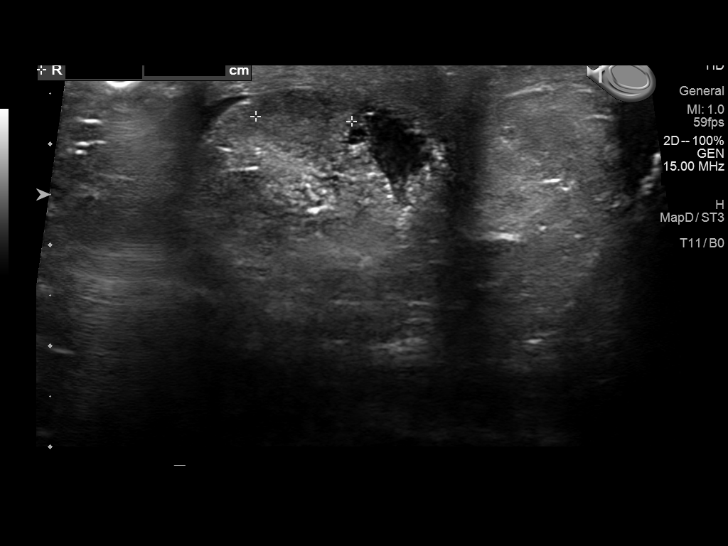
[im 64/67]
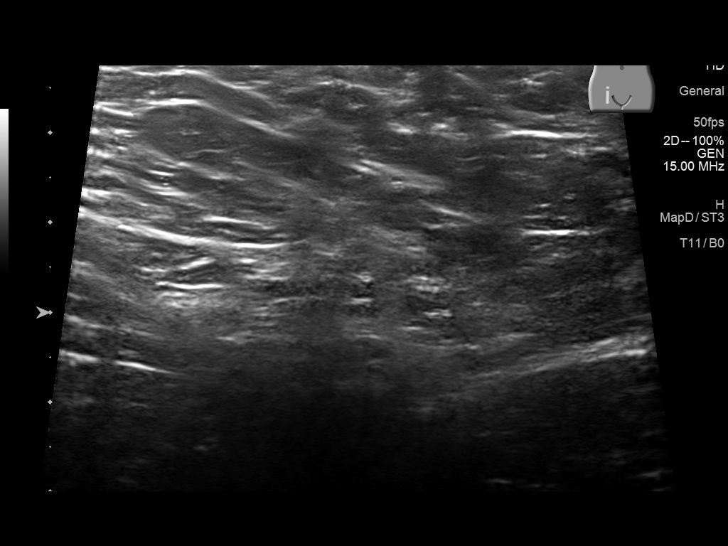

[Series 2001: us scrotum w/ doppler complete · 0.07mm/px · 1 of 1 slices shown (2 of 2)]
[im 1/1]
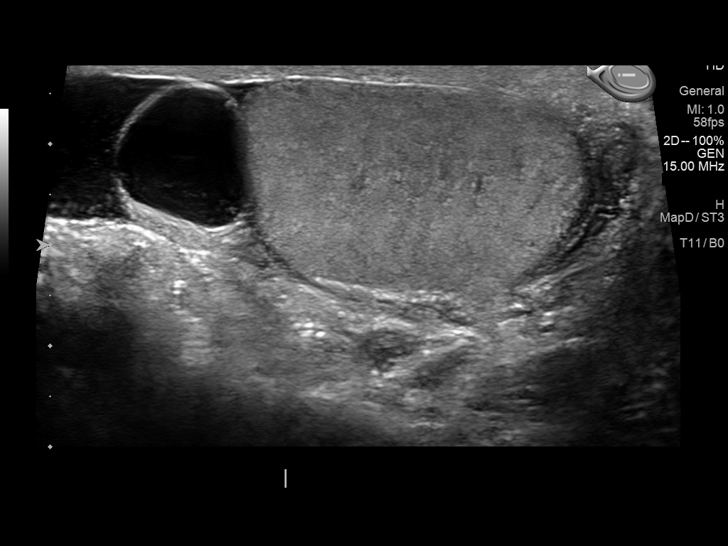

[14 of 25 positions shown; findings below may reference images not displayed]

FINDINGS: Right testicle

Measurements: 3.8 x 2.3 x 2.3 cm. No mass or microlithiasis
visualized.

Left testicle

Measurements: 3.7 x 2.1 x 2.7 cm. No mass or microlithiasis
visualized.

Right epididymis:  Normal in size and appearance.

Left epididymis: 1.4 x 1.3 x 1.8 cm hypoechoic avascular left
epididymal mass likely reflecting a spermatocele.

Hydrocele:  Small bilateral hydroceles.

Varicocele:  None visualized.

Pulsed Doppler interrogation of both testes demonstrates normal low
resistance arterial and venous waveforms bilaterally.

Other: No inguinal hernia.
IMPRESSION: 1. No testicular torsion.  No intratesticular mass.
2. No evidence of a inguinal hernia.
3. Left spermatocele.

## 2022-02-14 ENCOUNTER — Encounter: Payer: Self-pay | Admitting: Internal Medicine

## 2022-06-24 ENCOUNTER — Encounter: Payer: Self-pay | Admitting: Internal Medicine

## 2022-06-24 ENCOUNTER — Ambulatory Visit (INDEPENDENT_AMBULATORY_CARE_PROVIDER_SITE_OTHER): Payer: 59 | Admitting: Internal Medicine

## 2022-06-24 VITALS — BP 128/76 | HR 82 | Ht 71.0 in | Wt 239.0 lb

## 2022-06-24 DIAGNOSIS — R7303 Prediabetes: Secondary | ICD-10-CM

## 2022-06-24 DIAGNOSIS — Z23 Encounter for immunization: Secondary | ICD-10-CM

## 2022-06-24 DIAGNOSIS — R21 Rash and other nonspecific skin eruption: Secondary | ICD-10-CM

## 2022-06-24 DIAGNOSIS — F411 Generalized anxiety disorder: Secondary | ICD-10-CM

## 2022-06-24 DIAGNOSIS — J4520 Mild intermittent asthma, uncomplicated: Secondary | ICD-10-CM

## 2022-06-24 DIAGNOSIS — Z0000 Encounter for general adult medical examination without abnormal findings: Secondary | ICD-10-CM

## 2022-06-24 DIAGNOSIS — E782 Mixed hyperlipidemia: Secondary | ICD-10-CM

## 2022-06-24 MED ORDER — CLONAZEPAM 0.5 MG PO TABS: 0.5000 mg | ORAL_TABLET | Freq: Two times a day (BID) | ORAL | 0 refills | Status: DC | PRN

## 2022-06-24 NOTE — Progress Notes (Signed)
Date:  06/24/2022   Name:  BHAVESH VAZQUEZ   DOB:  1985/11/06   MRN:  [id number]   Chief Complaint: Annual Exam Richard Paul is a 36 y.o. male who presents today for his Complete Annual Exam. He feels well. He reports exercising. He reports he is sleeping well.   Colonoscopy: none  Immunization History  Administered Date(s) Administered   Influenza,inj,Quad PF,6+ Mos 08/28/2018, 06/15/2019, 06/19/2021   Janssen (J&J) SARS-COV-2 Vaccination 10/06/2019   Moderna Sars-Covid-2 Vaccination 06/10/2020   Pfizer Covid-19 Vaccine Bivalent Booster 22yr & up 07/12/2021   Tdap 06/09/2018   Health Maintenance Due  Topic Date Due   INFLUENZA VACCINE  02/26/2022   COVID-19 Vaccine (4 - 2023-24 season) 03/29/2022    No results found for: "PSA1", "PSA"   Asthma There is no shortness of breath or wheezing. This is a recurrent problem. The problem has been unchanged. Pertinent negatives include no appetite change, chest pain, headaches, myalgias or trouble swallowing. His symptoms are alleviated by beta-agonist. His past medical history is significant for asthma.  Rash This is a new problem. The affected locations include the chest. The rash is characterized by itchiness. He was exposed to nothing. Pertinent negatives include no diarrhea, fatigue or shortness of breath. Past treatments include anti-itch cream. The treatment provided mild relief. His past medical history is significant for asthma.    Lab Results  Component Value Date   NA 141 06/19/2021   K 4.4 06/19/2021   CO2 24 06/19/2021   GLUCOSE 85 06/19/2021   BUN 14 06/19/2021   CREATININE 1.17 06/19/2021   CALCIUM 9.8 06/19/2021   EGFR 82 06/19/2021   GFRNONAA 87 06/15/2020   Lab Results  Component Value Date   CHOL 259 (H) 06/19/2021   HDL 49 06/19/2021   LDLCALC 184 (H) 06/19/2021   TRIG 142 06/19/2021   CHOLHDL 5.3 (H) 06/19/2021   No results found for: "TSH" Lab Results  Component Value Date   HGBA1C 5.8 (H)  06/19/2021   Lab Results  Component Value Date   WBC 5.3 06/19/2021   HGB 14.4 06/19/2021   HCT 42.2 06/19/2021   MCV 91 06/19/2021   PLT 264 06/19/2021   Lab Results  Component Value Date   ALT 41 06/19/2021   AST 26 06/19/2021   ALKPHOS 79 06/19/2021   BILITOT <0.2 06/19/2021   No results found for: "25OHVITD2", "25OHVITD3", "VD25OH"   Review of Systems  Constitutional:  Negative for appetite change, chills, diaphoresis, fatigue and unexpected weight change.  HENT:  Negative for hearing loss, tinnitus, trouble swallowing and voice change.   Eyes:  Negative for visual disturbance.  Respiratory:  Negative for choking, shortness of breath and wheezing.   Cardiovascular:  Negative for chest pain, palpitations and leg swelling.  Gastrointestinal:  Negative for abdominal pain, blood in stool, constipation and diarrhea.  Genitourinary:  Negative for difficulty urinating, dysuria and frequency.  Musculoskeletal:  Negative for arthralgias, back pain and myalgias.  Skin:  Positive for rash. Negative for color change.  Neurological:  Negative for dizziness, syncope and headaches.  Hematological:  Negative for adenopathy.  Psychiatric/Behavioral:  Negative for dysphoric mood and sleep disturbance. The patient is not nervous/anxious.     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  Current Meds  Medication Sig   albuterol (VENTOLIN HFA) 108 (90 Base) MCG/ACT inhaler Inhale 2 puffs into the lungs every 6 (six) hours as needed.   clonazePAM (KLONOPIN) 0.5 MG tablet Take 1 tablet (0.5 mg total) by mouth 2 (two) times daily as needed for anxiety.   fluticasone (FLONASE) 50 MCG/ACT nasal spray SPRAY 2 SPRAYS INTO EACH NOSTRIL EVERY DAY   Multiple Vitamins-Minerals (ALIVE MENS ENERGY) TABS Take by mouth.       06/24/2022    9:10 AM 06/19/2021    8:00 AM 06/15/2020    8:15 AM 11/01/2019    9:30 AM  GAD 7 : Generalized Anxiety Score  Nervous, Anxious, on Edge 1 1 0 1  Control/stop worrying 0 0 0 1  Worry too much - different things 0 0 0 1  Trouble relaxing 0 0 0 0  Restless 0 0 0 0  Easily annoyed or irritable 3 2 0 3  Afraid - awful might happen 1 0 0 1  Total GAD 7 Score 5 3 0 7  Anxiety Difficulty Not difficult at all Not difficult at all  Not difficult at all       06/24/2022    9:10 AM 06/19/2021    8:00 AM 06/15/2020    8:15 AM  Depression screen PHQ 2/9  Decreased Interest 2 0 0  Down, Depressed, Hopeless 0 1 0  PHQ - 2 Score 2 1 0  Altered sleeping 1 0 0  Tired, decreased energy 2 1 0  Change in appetite 0 0 0  Feeling bad or failure about yourself  1 1 0  Trouble concentrating 0 0 0  Moving slowly or fidgety/restless 0 0 0  Suicidal thoughts 0 0 0  PHQ-9 Score 6 3 0  Difficult doing work/chores Somewhat difficult Somewhat difficult     BP Readings from Last 3 Encounters:  06/24/22 (!) 142/80  06/19/21 132/78  06/15/20 138/82    Physical Exam Vitals and nursing note reviewed.  Constitutional:      Appearance: Normal appearance. He is well-developed.  HENT:     Head: Normocephalic.     Right Ear: Tympanic membrane, ear canal and external ear normal.     Left Ear: Tympanic membrane, ear canal and external ear normal.     Nose: Nose normal.  Eyes:     Conjunctiva/sclera: Conjunctivae normal.     Pupils: Pupils are equal,  round, and reactive to light.  Neck:     Thyroid: No thyromegaly.     Vascular: No carotid bruit.  Cardiovascular:     Rate and Rhythm: Normal rate and regular rhythm.     Heart sounds: Normal heart sounds.  Pulmonary:     Effort: Pulmonary effort is normal.     Breath sounds: Normal breath sounds. No wheezing.  Chest:  Breasts:    Right: No mass.     Left: No mass.  Abdominal:     General: Bowel sounds are normal.  Palpations: Abdomen is soft.     Tenderness: There is no abdominal tenderness.  Musculoskeletal:        General: Normal range of motion.     Cervical back: Normal range of motion and neck supple.  Lymphadenopathy:     Cervical: No cervical adenopathy.  Skin:    General: Skin is warm and dry.     Findings: Rash present.     Comments: Brown papular lesions on mid chest - 1-3 mm in size No drainage, redness, or pustule formation  Neurological:     General: No focal deficit present.     Mental Status: He is alert and oriented to person, place, and time.     Deep Tendon Reflexes: Reflexes are normal and symmetric.  Psychiatric:        Attention and Perception: Attention normal.        Mood and Affect: Mood normal.        Thought Content: Thought content normal.     Wt Readings from Last 3 Encounters:  06/24/22 239 lb (108.4 kg)  06/19/21 238 lb 6.4 oz (108.1 kg)  06/15/20 238 lb (108 kg)    BP (!) 142/80   Pulse 82   Ht _0  (1.803 m)   Wt 239 lb (108.4 kg)   SpO2 98%   BMI 33.33 kg/m   Assessment and Plan: 1. Annual physical exam Normal exam Continue healthy diet and exercise  Monitor BP - CBC with Differential/Platelet - Comprehensive metabolic panel - Hemoglobin A1c - Lipid panel - TSH  2. Hyperlipidemia, mixed - Lipid panel  3. Mild intermittent asthma without complication Doing well on PRN Albuterol - CBC with Differential/Platelet  4. Prediabetes Continue diet and exercise - Comprehensive metabolic panel - Hemoglobin  A1c  5. Generalized anxiety disorder Using clonazepam PRN - used less than 15 in the past year - TSH - clonazePAM (KLONOPIN) 0.5 MG tablet; Take 1 tablet (0.5 mg total) by mouth 2 (two) times daily as needed for anxiety.  Dispense: 15 tablet; Refill: 0  6. Encounter for immunization Seyon Schwab Fall 2023 Covid-19 Vaccine 64yr and older  7. Need for immunization against influenza - Flu Vaccine QUAD 666moM (Fluarix, Fluzone & Alfiuria Quad PF)  8. Rash and nonspecific skin eruption Continue topical steroids for itching Recommend Dermatology evaluation   Partially dictated using DrMinerva ParkAny errors are unintentional.  LaHalina MaidensMD [facility]  06/24/2022

## 2022-06-24 NOTE — Patient Instructions (Addendum)
Regional Dermatology evaluation  Monitor BP and return if persistently elevated

## 2022-06-25 LAB — LIPID PANEL
Chol/HDL Ratio: 4.7 ratio (ref 0.0–5.0)
Cholesterol, Total: 271 mg/dL — ABNORMAL HIGH (ref 100–199)
HDL: 58 mg/dL (ref >39)
LDL Chol Calc (NIH): 178 mg/dL — ABNORMAL HIGH (ref 0–99)
Triglycerides: 190 mg/dL — ABNORMAL HIGH (ref 0–149)
VLDL Cholesterol Cal: 35 mg/dL (ref 5–40)

## 2022-06-25 LAB — CBC WITH DIFFERENTIAL/PLATELET
Basophils Absolute: 0 10*3/uL (ref 0.0–0.2)
Basos: 0 %
EOS (ABSOLUTE): 0.1 10*3/uL (ref 0.0–0.4)
Eos: 2 %
Hematocrit: 42.2 % (ref 37.5–51.0)
Hemoglobin: 14.6 g/dL (ref 13.0–17.7)
Immature Grans (Abs): 0 10*3/uL (ref 0.0–0.1)
Immature Granulocytes: 0 %
Lymphocytes Absolute: 2.2 10*3/uL (ref 0.7–3.1)
Lymphs: 46 %
MCH: 30.8 pg (ref 26.6–33.0)
MCHC: 34.6 g/dL (ref 31.5–35.7)
MCV: 89 fL (ref 79–97)
Monocytes Absolute: 0.4 10*3/uL (ref 0.1–0.9)
Monocytes: 8 %
Neutrophils Absolute: 2 10*3/uL (ref 1.4–7.0)
Neutrophils: 44 %
Platelets: 249 10*3/uL (ref 150–450)
RBC: 4.74 x10E6/uL (ref 4.14–5.80)
RDW: 11.3 % — ABNORMAL LOW (ref 11.6–15.4)
WBC: 4.7 10*3/uL (ref 3.4–10.8)

## 2022-06-25 LAB — COMPREHENSIVE METABOLIC PANEL
ALT: 26 IU/L (ref 0–44)
AST: 25 IU/L (ref 0–40)
Albumin/Globulin Ratio: 1.8 (ref 1.2–2.2)
Albumin: 4.5 g/dL (ref 4.1–5.1)
Alkaline Phosphatase: 75 IU/L (ref 44–121)
BUN/Creatinine Ratio: 9 (ref 9–20)
BUN: 10 mg/dL (ref 6–20)
Bilirubin Total: 0.3 mg/dL (ref 0.0–1.2)
CO2: 23 mmol/L (ref 20–29)
Calcium: 9.8 mg/dL (ref 8.7–10.2)
Chloride: 101 mmol/L (ref 96–106)
Creatinine, Ser: 1.09 mg/dL (ref 0.76–1.27)
Globulin, Total: 2.5 g/dL (ref 1.5–4.5)
Glucose: 91 mg/dL (ref 70–99)
Potassium: 4.5 mmol/L (ref 3.5–5.2)
Sodium: 138 mmol/L (ref 134–144)
Total Protein: 7 g/dL (ref 6.0–8.5)
eGFR: 89 mL/min/{1.73_m2} (ref >59)

## 2022-06-25 LAB — HEMOGLOBIN A1C
Est. average glucose Bld gHb Est-mCnc: 123 mg/dL
Hgb A1c MFr Bld: 5.9 % — ABNORMAL HIGH (ref 4.8–5.6)

## 2022-06-25 LAB — TSH: TSH: 1.11 u[IU]/mL (ref 0.450–4.500)

## 2023-01-26 ENCOUNTER — Encounter: Payer: Self-pay | Admitting: Internal Medicine

## 2023-06-27 ENCOUNTER — Encounter: Payer: 59 | Admitting: Internal Medicine

## 2023-07-01 ENCOUNTER — Encounter: Payer: 59 | Admitting: Internal Medicine

## 2023-07-09 ENCOUNTER — Ambulatory Visit: Payer: BC Managed Care – PPO | Admitting: Internal Medicine

## 2023-07-09 ENCOUNTER — Encounter: Payer: Self-pay | Admitting: Internal Medicine

## 2023-07-09 VITALS — BP 118/84 | HR 72 | Ht 71.0 in | Wt 244.4 lb

## 2023-07-09 DIAGNOSIS — J4520 Mild intermittent asthma, uncomplicated: Secondary | ICD-10-CM

## 2023-07-09 DIAGNOSIS — Z0000 Encounter for general adult medical examination without abnormal findings: Secondary | ICD-10-CM

## 2023-07-09 DIAGNOSIS — E782 Mixed hyperlipidemia: Secondary | ICD-10-CM | POA: Diagnosis not present

## 2023-07-09 DIAGNOSIS — F411 Generalized anxiety disorder: Secondary | ICD-10-CM

## 2023-07-09 DIAGNOSIS — R7303 Prediabetes: Secondary | ICD-10-CM

## 2023-07-09 DIAGNOSIS — Z23 Encounter for immunization: Secondary | ICD-10-CM | POA: Diagnosis not present

## 2023-07-09 MED ORDER — ALBUTEROL SULFATE HFA 108 (90 BASE) MCG/ACT IN AERS: 2.0000 | INHALATION_SPRAY | Freq: Four times a day (QID) | RESPIRATORY_TRACT | 2 refills | Status: AC | PRN

## 2023-07-09 NOTE — Progress Notes (Unsigned)
Date:  07/09/2023   Name:  Richard Paul   DOB:  1985-08-01   MRN:  [id number]   Chief Complaint: Annual Exam Richard Paul is a 37 y.o. male who presents today for his Complete Annual Exam. He feels well. He reports exercising- not at this time. He reports he is sleeping fairly well.   Colonoscopy: none  Immunization History  Administered Date(s) Administered   Influenza,inj,Quad PF,6+ Mos 08/28/2018, 06/15/2019, 06/19/2021, 06/24/2022   Janssen (J&J) SARS-COV-2 Vaccination 10/06/2019   Moderna Sars-Covid-2 Vaccination 06/10/2020   Pfizer Covid-19 Vaccine Bivalent Booster 77yrs & up 07/12/2021   Pfizer(Comirnaty)Fall Seasonal Vaccine 12 years and older 06/24/2022   Tdap 06/09/2018   Health Maintenance Due  Topic Date Due   Pneumococcal Vaccine 70-4 Years old (1 of 2 - PCV) Never done   INFLUENZA VACCINE  02/27/2023   COVID-19 Vaccine (5 - 2023-24 season) 03/30/2023    No results found for: "PSA1", "PSA"   HPI  Review of Systems  Constitutional:  Negative for appetite change, chills, diaphoresis, fatigue and unexpected weight change.  HENT:  Negative for hearing loss, tinnitus, trouble swallowing and voice change.   Eyes:  Negative for visual disturbance.  Respiratory:  Negative for choking, shortness of breath and wheezing.   Cardiovascular:  Negative for chest pain, palpitations and leg swelling.  Gastrointestinal:  Negative for abdominal pain, blood in stool, constipation and diarrhea.  Genitourinary:  Negative for difficulty urinating, dysuria and frequency.  Musculoskeletal:  Negative for arthralgias, back pain and myalgias.  Skin:  Negative for color change and rash.  Neurological:  Negative for dizziness, syncope and headaches.  Hematological:  Negative for adenopathy.  Psychiatric/Behavioral:  Negative for dysphoric mood and sleep disturbance. The patient is not nervous/anxious.      Lab Results  Component Value Date   NA 138 06/24/2022   K 4.5  06/24/2022   CO2 23 06/24/2022   GLUCOSE 91 06/24/2022   BUN 10 06/24/2022   CREATININE 1.09 06/24/2022   CALCIUM 9.8 06/24/2022   EGFR 89 06/24/2022   GFRNONAA 87 06/15/2020   Lab Results  Component Value Date   CHOL 271 (H) 06/24/2022   HDL 58 06/24/2022   LDLCALC 178 (H) 06/24/2022   TRIG 190 (H) 06/24/2022   CHOLHDL 4.7 06/24/2022   Lab Results  Component Value Date   TSH 1.110 06/24/2022   Lab Results  Component Value Date   HGBA1C 5.9 (H) 06/24/2022   Lab Results  Component Value Date   WBC 4.7 06/24/2022   HGB 14.6 06/24/2022   HCT 42.2 06/24/2022   MCV 89 06/24/2022   PLT 249 06/24/2022   Lab Results  Component Value Date   ALT 26 06/24/2022   AST 25 06/24/2022   ALKPHOS 75 06/24/2022   BILITOT 0.3 06/24/2022   No results found for: "25OHVITD2", "25OHVITD3", "VD25OH"   Patient Active Problem List   Diagnosis Date Noted   Spermatocele 06/15/2019   Generalized anxiety disorder 11/12/2017   Hyperlipidemia, mixed 07/05/2016   Heel spur, left 07/05/2016   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  Current Meds  Medication Sig   clonazePAM (KLONOPIN) 0.5 MG tablet Take 1 tablet (0.5 mg total) by mouth 2 (two) times daily as needed for anxiety.   fluticasone (FLONASE) 50 MCG/ACT nasal spray SPRAY 2 SPRAYS INTO EACH NOSTRIL EVERY DAY   Multiple Vitamins-Minerals (ALIVE MENS ENERGY) TABS Take by mouth.   [DISCONTINUED] albuterol (VENTOLIN HFA) 108 (90 Base) MCG/ACT inhaler Inhale 2 puffs into the lungs every 6 (six) hours as needed.        07/09/2023    3:49 PM 06/24/2022    9:10 AM 06/19/2021    8:00 AM 06/15/2020    8:15 AM  GAD 7 : Generalized Anxiety Score  Nervous, Anxious, on Edge 1 1 1  0  Control/stop worrying 0 0 0 0  Worry too much - different things 0 0 0 0  Trouble relaxing 2 0 0 0  Restless 0 0 0 0  Easily annoyed or irritable 3 3 2  0  Afraid - awful might happen 1 1 0 0  Total GAD 7 Score 7 5 3  0  Anxiety Difficulty Somewhat difficult Not difficult at all Not difficult at all        07/09/2023    3:49 PM 06/24/2022    9:10 AM 06/19/2021    8:00 AM  Depression screen PHQ 2/9  Decreased Interest 3 2 0  Down, Depressed, Hopeless 1 0 1  PHQ - 2 Score 4 2 1   Altered sleeping 2 1 0  Tired, decreased energy 2 2 1   Change in appetite 0 0 0  Feeling bad or failure about yourself  0 1 1  Trouble concentrating 2 0 0  Moving slowly or fidgety/restless 2 0 0  Suicidal thoughts 0 0 0  PHQ-9 Score 12 6 3   Difficult doing work/chores Somewhat difficult Somewhat difficult Somewhat difficult    BP Readings from Last 3 Encounters:  07/09/23 118/84  06/24/22 128/76  06/19/21 132/78    Physical Exam Vitals and nursing note reviewed.  Constitutional:      Appearance: Normal appearance. He is well-developed.  HENT:     Head: Normocephalic.     Right Ear: Tympanic membrane, ear canal and external ear normal.     Left Ear: Tympanic membrane, ear canal and external ear normal.     Nose: Nose normal.  Eyes:     Conjunctiva/sclera: Conjunctivae normal.     Pupils: Pupils are equal, round, and reactive to light.  Neck:     Thyroid: No thyromegaly.     Vascular: No carotid bruit.  Cardiovascular:     Rate and Rhythm: Normal rate and regular rhythm.     Heart sounds: Normal heart sounds.  Pulmonary:     Effort: Pulmonary effort is normal.     Breath sounds: Normal breath sounds. No wheezing.  Chest:  Breasts:    Right: No mass.     Left: No mass.  Abdominal:     General: Bowel sounds are  normal.     Palpations: Abdomen is soft.     Tenderness: There is no abdominal tenderness.  Musculoskeletal:        General: Normal range of motion.     Cervical back: Normal range of motion and neck supple.  Lymphadenopathy:     Cervical: No cervical adenopathy.  Skin:    General: Skin is warm and dry.  Neurological:  Mental Status: He is alert and oriented to person, place, and time.     Deep Tendon Reflexes: Reflexes are normal and symmetric.  Psychiatric:        Attention and Perception: Attention normal.        Mood and Affect: Mood normal.        Thought Content: Thought content normal.     Wt Readings from Last 3 Encounters:  07/09/23 244 lb 6.4 oz (110.9 kg)  06/24/22 239 lb (108.4 kg)  06/19/21 238 lb 6.4 oz (108.1 kg)    BP 118/84   Pulse 72   Ht 5\' 11"  (1.803 m)   Wt 244 lb 6.4 oz (110.9 kg)   SpO2 97%   BMI 34.09 kg/m   Assessment and Plan:  Problem List Items Addressed This Visit       Unprioritized   Hyperlipidemia, mixed   Relevant Orders   Lipid panel   Mild intermittent asthma without complication   Relevant Medications   albuterol (VENTOLIN HFA) 108 (90 Base) MCG/ACT inhaler   Other Relevant Orders   CBC with Differential/Platelet   Generalized anxiety disorder    Mild symptoms at present - improved with counseling Taking clonazepam rarely      Relevant Orders   TSH   Other Visit Diagnoses     Annual physical exam    -  Primary   Relevant Orders   CBC with Differential/Platelet   Comprehensive metabolic panel   Hemoglobin A1c   Lipid panel   Prediabetes       Relevant Orders   Comprehensive metabolic panel   Hemoglobin A1c   Need for influenza vaccination       Relevant Orders   Flu vaccine trivalent PF, 6mos and older(Flulaval,Afluria,Fluarix,Fluzone)       No follow-ups on file.    Reubin Milan, MD [facility] [place]

## 2023-07-09 NOTE — Assessment & Plan Note (Signed)
Mild symptoms at present - improved with counseling Taking clonazepam rarely

## 2023-07-10 LAB — CBC WITH DIFFERENTIAL/PLATELET
Basophils Absolute: 0 10*3/uL (ref 0.0–0.2)
Basos: 0 %
EOS (ABSOLUTE): 0.1 10*3/uL (ref 0.0–0.4)
Eos: 1 %
Hematocrit: 41.1 % (ref 37.5–51.0)
Hemoglobin: 13.9 g/dL (ref 13.0–17.7)
Immature Grans (Abs): 0 10*3/uL (ref 0.0–0.1)
Immature Granulocytes: 0 %
Lymphocytes Absolute: 2.6 10*3/uL (ref 0.7–3.1)
Lymphs: 47 %
MCH: 31.4 pg (ref 26.6–33.0)
MCHC: 33.8 g/dL (ref 31.5–35.7)
MCV: 93 fL (ref 79–97)
Monocytes Absolute: 0.4 10*3/uL (ref 0.1–0.9)
Monocytes: 8 %
Neutrophils Absolute: 2.5 10*3/uL (ref 1.4–7.0)
Neutrophils: 44 %
Platelets: 264 10*3/uL (ref 150–450)
RBC: 4.43 x10E6/uL (ref 4.14–5.80)
RDW: 12.3 % (ref 11.6–15.4)
WBC: 5.6 10*3/uL (ref 3.4–10.8)

## 2023-07-10 LAB — COMPREHENSIVE METABOLIC PANEL
ALT: 32 [IU]/L (ref 0–44)
AST: 29 [IU]/L (ref 0–40)
Albumin: 4.4 g/dL (ref 4.1–5.1)
Alkaline Phosphatase: 71 [IU]/L (ref 44–121)
BUN/Creatinine Ratio: 11 (ref 9–20)
BUN: 11 mg/dL (ref 6–20)
Bilirubin Total: 0.4 mg/dL (ref 0.0–1.2)
CO2: 23 mmol/L (ref 20–29)
Calcium: 9.7 mg/dL (ref 8.7–10.2)
Chloride: 102 mmol/L (ref 96–106)
Creatinine, Ser: 0.98 mg/dL (ref 0.76–1.27)
Globulin, Total: 2.6 g/dL (ref 1.5–4.5)
Glucose: 76 mg/dL (ref 70–99)
Potassium: 4.2 mmol/L (ref 3.5–5.2)
Sodium: 140 mmol/L (ref 134–144)
Total Protein: 7 g/dL (ref 6.0–8.5)
eGFR: 101 mL/min/{1.73_m2} (ref >59)

## 2023-07-10 LAB — LIPID PANEL
Chol/HDL Ratio: 4.5 {ratio} (ref 0.0–5.0)
Cholesterol, Total: 245 mg/dL — ABNORMAL HIGH (ref 100–199)
HDL: 54 mg/dL (ref >39)
LDL Chol Calc (NIH): 173 mg/dL — ABNORMAL HIGH (ref 0–99)
Triglycerides: 102 mg/dL (ref 0–149)
VLDL Cholesterol Cal: 18 mg/dL (ref 5–40)

## 2023-07-10 LAB — TSH: TSH: 0.899 u[IU]/mL (ref 0.450–4.500)

## 2023-07-10 LAB — HEMOGLOBIN A1C
Est. average glucose Bld gHb Est-mCnc: 123 mg/dL
Hgb A1c MFr Bld: 5.9 % — ABNORMAL HIGH (ref 4.8–5.6)

## 2024-01-12 DIAGNOSIS — R030 Elevated blood-pressure reading, without diagnosis of hypertension: Secondary | ICD-10-CM | POA: Diagnosis not present

## 2024-01-12 DIAGNOSIS — J4521 Mild intermittent asthma with (acute) exacerbation: Secondary | ICD-10-CM | POA: Diagnosis not present

## 2024-01-12 DIAGNOSIS — R0789 Other chest pain: Secondary | ICD-10-CM | POA: Diagnosis not present

## 2024-05-10 ENCOUNTER — Encounter: Payer: Self-pay | Admitting: Internal Medicine

## 2024-05-10 ENCOUNTER — Other Ambulatory Visit: Payer: Self-pay | Admitting: Internal Medicine

## 2024-05-10 DIAGNOSIS — F411 Generalized anxiety disorder: Secondary | ICD-10-CM

## 2024-05-10 MED ORDER — CLONAZEPAM 0.5 MG PO TABS: 0.5000 mg | ORAL_TABLET | Freq: Two times a day (BID) | ORAL | 0 refills | Status: AC | PRN

## 2024-05-10 NOTE — Progress Notes (Unsigned)
 Date:  05/10/2024   Name:  Richard Paul   DOB:  06/19/1986   MRN:  [id number]   Chief Complaint: No chief complaint on file.  HPI  Review of Systems   Lab Results  Component Value Date   NA 140 07/09/2023   K 4.2 07/09/2023   CO2 23 07/09/2023   GLUCOSE 76 07/09/2023   BUN 11 07/09/2023   CREATININE 0.98 07/09/2023   CALCIUM 9.7 07/09/2023   EGFR 101 07/09/2023   GFRNONAA 87 06/15/2020   Lab Results  Component Value Date   CHOL 245 (H) 07/09/2023   HDL 54 07/09/2023   LDLCALC 173 (H) 07/09/2023   TRIG 102 07/09/2023   CHOLHDL 4.5 07/09/2023   Lab Results  Component Value Date   TSH 0.899 07/09/2023   Lab Results  Component Value Date   HGBA1C 5.9 (H) 07/09/2023   Lab Results  Component Value Date   WBC 5.6 07/09/2023   HGB 13.9 07/09/2023   HCT 41.1 07/09/2023   MCV 93 07/09/2023   PLT 264 07/09/2023   Lab Results  Component Value Date   ALT 32 07/09/2023   AST 29 07/09/2023   ALKPHOS 71 07/09/2023   BILITOT 0.4 07/09/2023   No results found for: MARIEN BOLLS, VD25OH   Patient Active Problem List   Diagnosis Date Noted   Spermatocele 06/15/2019   Generalized anxiety disorder 11/12/2017   Hyperlipidemia, mixed 07/05/2016   Heel spur, left 07/05/2016   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  No outpatient medications have been marked as taking for the 05/10/24 encounter (Orders Only) with Justus Leita DEL, MD.       07/09/2023    3:49 PM 06/24/2022    9:10 AM 06/19/2021    8:00 AM 06/15/2020     8:15 AM  GAD 7 : Generalized Anxiety Score  Nervous, Anxious, on Edge 1 1 1  0  Control/stop worrying 0 0 0 0  Worry too much - different things 0 0 0 0  Trouble relaxing 2 0 0 0  Restless 0 0 0 0  Easily annoyed or irritable 3 3 2  0  Afraid - awful might happen 1 1 0 0  Total GAD 7 Score 7 5 3  0  Anxiety Difficulty Somewhat difficult Not difficult at all Not difficult at all        07/09/2023    3:49 PM 06/24/2022    9:10 AM 06/19/2021    8:00 AM  Depression screen PHQ 2/9  Decreased Interest 3 2 0  Down, Depressed, Hopeless 1 0 1  PHQ - 2 Score 4 2 1   Altered sleeping 2 1 0  Tired, decreased energy 2 2 1   Change in appetite 0 0 0  Feeling bad or failure about yourself  0 1 1  Trouble concentrating 2 0 0  Moving slowly or fidgety/restless 2 0 0  Suicidal thoughts 0 0 0  PHQ-9 Score 12 6 3   Difficult doing  work/chores Somewhat difficult Somewhat difficult Somewhat difficult    BP Readings from Last 3 Encounters:  07/09/23 118/84  06/24/22 128/76  06/19/21 132/78    Physical Exam  Wt Readings from Last 3 Encounters:  07/09/23 244 lb 6.4 oz (110.9 kg)  06/24/22 239 lb (108.4 kg)  06/19/21 238 lb 6.4 oz (108.1 kg)    There were no vitals taken for this visit.  Assessment and Plan:  Problem List Items Addressed This Visit   None   No follow-ups on file.    Leita HILARIO Adie, MD [facility] [place]

## 2024-05-10 NOTE — Telephone Encounter (Signed)
 Please review.  KP

## 2024-07-09 ENCOUNTER — Encounter: Payer: Self-pay | Admitting: Internal Medicine

## 2024-07-09 ENCOUNTER — Ambulatory Visit: Payer: Self-pay | Admitting: Internal Medicine

## 2024-07-09 VITALS — BP 124/78 | HR 76 | Ht 71.0 in | Wt 251.0 lb

## 2024-07-09 DIAGNOSIS — Z0000 Encounter for general adult medical examination without abnormal findings: Secondary | ICD-10-CM | POA: Diagnosis not present

## 2024-07-09 DIAGNOSIS — E782 Mixed hyperlipidemia: Secondary | ICD-10-CM | POA: Diagnosis not present

## 2024-07-09 DIAGNOSIS — Z23 Encounter for immunization: Secondary | ICD-10-CM | POA: Diagnosis not present

## 2024-07-09 DIAGNOSIS — R7303 Prediabetes: Secondary | ICD-10-CM | POA: Diagnosis not present

## 2024-07-09 DIAGNOSIS — J4520 Mild intermittent asthma, uncomplicated: Secondary | ICD-10-CM | POA: Diagnosis not present

## 2024-07-09 DIAGNOSIS — F411 Generalized anxiety disorder: Secondary | ICD-10-CM | POA: Diagnosis not present

## 2024-07-09 NOTE — Assessment & Plan Note (Signed)
 Mild elevation in LDL - on no medications Continue diet control.

## 2024-07-09 NOTE — Assessment & Plan Note (Signed)
 Mild anxiety, mostly situational.  Symptoms are stable. Takes clonazepam  very rarely.

## 2024-07-09 NOTE — Progress Notes (Signed)
 Date:  07/09/2024   Name:  Richard Paul   DOB:  1986-02-01   MRN:  [id number]   Chief Complaint: Annual Exam Richard Paul is a 38 y.o. male who presents today for his Complete Annual Exam. He feels well. He reports exercising- some. He reports he is sleeping well.   Health Maintenance  Topic Date Due   Pneumococcal Vaccine (1 of 2 - PCV) Never done   Hepatitis B Vaccine (1 of 3 - 19+ 3-dose series) Never done   HPV Vaccine (1 - 3-dose SCDM series) Never done   COVID-19 Vaccine (5 - 2025-26 season) 03/29/2024   DTaP/Tdap/Td vaccine (2 - Td or Tdap) 06/09/2028   Flu Shot  Completed   Hepatitis C Screening  Completed   HIV Screening  Completed   Meningitis B Vaccine  Aged Out   No results found for: PSA1, PSA  HPI  Review of Systems  Constitutional:  Negative for fatigue and unexpected weight change.  HENT:  Negative for nosebleeds.   Eyes:  Negative for visual disturbance.  Respiratory:  Negative for cough, chest tightness, shortness of breath and wheezing.   Cardiovascular:  Negative for chest pain, palpitations and leg swelling.  Gastrointestinal:  Negative for abdominal pain, constipation and diarrhea.  Genitourinary:  Negative for dysuria and urgency.  Musculoskeletal:  Negative for arthralgias and joint swelling.  Neurological:  Negative for dizziness, weakness, light-headedness and headaches.  Psychiatric/Behavioral:  Negative for dysphoric mood and sleep disturbance. The patient is not nervous/anxious.      Lab Results  Component Value Date   NA 140 07/09/2023   K 4.2 07/09/2023   CO2 23 07/09/2023   GLUCOSE 76 07/09/2023   BUN 11 07/09/2023   CREATININE 0.98 07/09/2023   CALCIUM 9.7 07/09/2023   EGFR 101 07/09/2023   GFRNONAA 87 06/15/2020   Lab Results  Component Value Date   CHOL 245 (H) 07/09/2023   HDL 54 07/09/2023   LDLCALC 173 (H) 07/09/2023   TRIG 102 07/09/2023   CHOLHDL 4.5 07/09/2023   Lab Results  Component Value Date   TSH  0.899 07/09/2023   Lab Results  Component Value Date   HGBA1C 5.9 (H) 07/09/2023   Lab Results  Component Value Date   WBC 5.6 07/09/2023   HGB 13.9 07/09/2023   HCT 41.1 07/09/2023   MCV 93 07/09/2023   PLT 264 07/09/2023   Lab Results  Component Value Date   ALT 32 07/09/2023   AST 29 07/09/2023   ALKPHOS 71 07/09/2023   BILITOT 0.4 07/09/2023   No results found for: MARIEN BOLLS, VD25OH   Patient Active Problem List   Diagnosis Date Noted   Prediabetes 07/09/2024   Spermatocele 06/15/2019   Generalized anxiety disorder 11/12/2017   Hyperlipidemia, mixed 07/05/2016   Heel spur, left 07/05/2016   Compression 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  Active Medications[3]     07/09/2024    9:25 AM 07/09/2023    3:49 PM 06/24/2022    9:10 AM 06/19/2021    8:00 AM  GAD 7 : Generalized Anxiety Score  Nervous, Anxious, on Edge 0 1 1 1   Control/stop worrying 0 0 0 0  Worry too much - different things 0 0 0 0  Trouble relaxing 0 2 0 0  Restless 0 0 0 0  Easily annoyed or irritable 0 3 3 2   Afraid - awful might happen 0 1 1 0  Total GAD 7 Score 0 7 5 3   Anxiety Difficulty Not difficult at all Somewhat difficult Not difficult at all Not difficult at all       07/09/2024    9:25 AM 07/09/2023    3:49 PM 06/24/2022    9:10 AM  Depression screen PHQ 2/9  Decreased Interest 0 3 2  Down, Depressed, Hopeless 0 1 0  PHQ - 2 Score 0 4 2  Altered sleeping 0 2 1  Tired, decreased energy 0 2 2  Change in appetite 0 0 0  Feeling bad or failure about yourself  0 0 1  Trouble concentrating 0 2 0  Moving slowly or fidgety/restless 0 2 0  Suicidal thoughts 0 0 0  PHQ-9 Score 0 12  6   Difficult  doing work/chores Not difficult at all Somewhat difficult Somewhat difficult     Data saved with a previous flowsheet row definition    BP Readings from Last 3 Encounters:  07/09/24 124/78  07/09/23 118/84  06/24/22 128/76    Physical Exam Vitals and nursing note reviewed.  Constitutional:      Appearance: Normal appearance. He is well-developed.  HENT:     Head: Normocephalic.     Right Ear: Tympanic membrane, ear canal and external ear normal.     Left Ear: Tympanic membrane, ear canal and external ear normal.     Nose: Nose normal.  Eyes:     Conjunctiva/sclera: Conjunctivae normal.     Pupils: Pupils are equal, round, and reactive to light.  Neck:     Thyroid: No thyromegaly.     Vascular: No carotid bruit.  Cardiovascular:     Rate and Rhythm: Normal rate and regular rhythm.     Heart sounds: Normal heart sounds.  Pulmonary:     Effort: Pulmonary effort is normal.     Breath sounds: Normal breath sounds. No wheezing.  Chest:  Breasts:    Right: No mass.     Left: No mass.  Abdominal:     General: Bowel sounds are normal.     Palpations: Abdomen is soft.     Tenderness: There is no abdominal tenderness.  Musculoskeletal:        General: Normal range of motion.     Cervical back: Normal range of motion and neck supple.     Right lower leg: No edema.     Left lower leg: No edema.  Lymphadenopathy:     Cervical: No cervical adenopathy.  Skin:    General: Skin is warm and dry.     Capillary Refill: Capillary refill takes less than 2 seconds.     Findings: No lesion.  Neurological:     General: No focal deficit present.     Mental Status: He is alert and oriented to person, place, and time.     Cranial Nerves: No cranial nerve deficit.     Gait: Gait normal.     Deep Tendon Reflexes: Reflexes are normal and symmetric.  Psychiatric:        Attention and Perception: Attention normal.        Mood and Affect: Mood normal.        Thought Content: Thought content  normal.  Wt Readings from Last 3 Encounters:  07/09/24 251 lb (113.9 kg)  07/09/23 244 lb 6.4 oz (110.9 kg)  06/24/22 239 lb (108.4 kg)    BP 124/78   Pulse 76   Ht 5' 11 (1.803 m)   Wt 251 lb (113.9 kg)   SpO2 97%   BMI 35.01 kg/m   Assessment and Plan:  Problem List Items Addressed This Visit       Unprioritized   Hyperlipidemia, mixed   Mild elevation in LDL - on no medications Continue diet control.      Relevant Orders   Lipid panel   Mild intermittent asthma without complication   Mild intermittent symptoms controlled with prn Albuterol  and Flonase  for allergic component.      Relevant Orders   CBC with Differential/Platelet   Generalized anxiety disorder   Mild anxiety, mostly situational.  Symptoms are stable. Takes clonazepam  very rarely.      Relevant Orders   TSH   Prediabetes   Managed with diet only.  Lab Results  Component Value Date   HGBA1C 5.9 (H) 07/09/2023         Relevant Orders   Comprehensive metabolic panel with GFR   Hemoglobin A1c   Other Visit Diagnoses       Annual physical exam    -  Primary   continue diet and exercise efforts to avoid further weight gain due for Flu and Prevnar   Relevant Orders   CBC with Differential/Platelet   Comprehensive metabolic panel with GFR   Hemoglobin A1c   Lipid panel   TSH     Encounter for immunization       Relevant Orders   Flu vaccine trivalent PF, 6mos and older(Flulaval,Afluria,Fluarix,Fluzone) (Completed)       Return in about 1 year (around 07/09/2025) for CPX.    Leita HILARIO Adie, MD  [facility] [place]           [1] No Known Allergies [2]  Social History Tobacco Use   Smoking status: Never   Smokeless tobacco: Never  Substance Use Topics   Alcohol use: Yes    Comment: occassion    Drug use: No  [3]  Current Meds  Medication Sig   albuterol  (VENTOLIN  HFA) 108 (90 Base) MCG/ACT inhaler Inhale 2 puffs into the  lungs every 6 (six) hours as needed.   clonazePAM  (KLONOPIN ) 0.5 MG tablet Take 1 tablet (0.5 mg total) by mouth 2 (two) times daily as needed for anxiety.   fluticasone  (FLONASE ) 50 MCG/ACT nasal spray SPRAY 2 SPRAYS INTO EACH NOSTRIL EVERY DAY   Multiple Vitamins-Minerals (ALIVE MENS ENERGY) TABS Take by mouth.

## 2024-07-09 NOTE — Assessment & Plan Note (Signed)
 Managed with diet only.  Lab Results  Component Value Date   HGBA1C 5.9 (H) 07/09/2023

## 2024-07-09 NOTE — Assessment & Plan Note (Signed)
 Mild intermittent symptoms controlled with prn Albuterol  and Flonase  for allergic component.

## 2024-07-10 LAB — CBC WITH DIFFERENTIAL/PLATELET
Basophils Absolute: 0 x10E3/uL (ref 0.0–0.2)
Basos: 1 %
EOS (ABSOLUTE): 0 x10E3/uL (ref 0.0–0.4)
Eos: 0 %
Hematocrit: 42 % (ref 37.5–51.0)
Hemoglobin: 14 g/dL (ref 13.0–17.7)
Immature Grans (Abs): 0 x10E3/uL (ref 0.0–0.1)
Immature Granulocytes: 0 %
Lymphocytes Absolute: 2.2 x10E3/uL (ref 0.7–3.1)
Lymphs: 42 %
MCH: 30.7 pg (ref 26.6–33.0)
MCHC: 33.3 g/dL (ref 31.5–35.7)
MCV: 92 fL (ref 79–97)
Monocytes Absolute: 0.4 x10E3/uL (ref 0.1–0.9)
Monocytes: 7 %
Neutrophils Absolute: 2.6 x10E3/uL (ref 1.4–7.0)
Neutrophils: 50 %
Platelets: 289 x10E3/uL (ref 150–450)
RBC: 4.56 x10E6/uL (ref 4.14–5.80)
RDW: 12.6 % (ref 11.6–15.4)
WBC: 5.2 x10E3/uL (ref 3.4–10.8)

## 2024-07-10 LAB — COMPREHENSIVE METABOLIC PANEL WITH GFR
ALT: 32 IU/L (ref 0–44)
AST: 23 IU/L (ref 0–40)
Albumin: 4.5 g/dL (ref 4.1–5.1)
Alkaline Phosphatase: 77 IU/L (ref 47–123)
BUN/Creatinine Ratio: 10 (ref 9–20)
BUN: 11 mg/dL (ref 6–24)
Bilirubin Total: 0.3 mg/dL (ref 0.0–1.2)
CO2: 24 mmol/L (ref 20–29)
Calcium: 9.6 mg/dL (ref 8.7–10.2)
Chloride: 101 mmol/L (ref 96–106)
Creatinine, Ser: 1.1 mg/dL (ref 0.76–1.27)
Globulin, Total: 2.3 g/dL (ref 1.5–4.5)
Glucose: 87 mg/dL (ref 70–99)
Potassium: 4.6 mmol/L (ref 3.5–5.2)
Sodium: 140 mmol/L (ref 134–144)
Total Protein: 6.8 g/dL (ref 6.0–8.5)
eGFR: 87 mL/min/1.73 (ref >59)

## 2024-07-10 LAB — LIPID PANEL
Chol/HDL Ratio: 4.9 ratio (ref 0.0–5.0)
Cholesterol, Total: 268 mg/dL — ABNORMAL HIGH (ref 100–199)
HDL: 55 mg/dL (ref >39)
LDL Chol Calc (NIH): 194 mg/dL — ABNORMAL HIGH (ref 0–99)
Triglycerides: 107 mg/dL (ref 0–149)
VLDL Cholesterol Cal: 19 mg/dL (ref 5–40)

## 2024-07-10 LAB — HEMOGLOBIN A1C
Est. average glucose Bld gHb Est-mCnc: 120 mg/dL
Hgb A1c MFr Bld: 5.8 % — ABNORMAL HIGH (ref 4.8–5.6)

## 2024-07-10 LAB — TSH: TSH: 0.746 u[IU]/mL (ref 0.450–4.500)

## 2024-07-11 ENCOUNTER — Ambulatory Visit: Payer: Self-pay | Admitting: Internal Medicine

## 2025-07-11 ENCOUNTER — Encounter: Admitting: Family Medicine
